# Patient Record
Sex: Male | Born: 1970 | Race: White | Hispanic: No | Marital: Married | Smoking: Never smoker
Health system: Southern US, Community
[De-identification: ages and names within clinical notes are randomized; demographics above are authoritative.]

## PROBLEM LIST (undated history)

## (undated) DIAGNOSIS — M109 Gout, unspecified: Secondary | ICD-10-CM

## (undated) DIAGNOSIS — E785 Hyperlipidemia, unspecified: Secondary | ICD-10-CM

## (undated) DIAGNOSIS — I10 Essential (primary) hypertension: Secondary | ICD-10-CM

## (undated) HISTORY — DX: Essential (primary) hypertension: I10

## (undated) HISTORY — DX: Gout, unspecified: M10.9

## (undated) HISTORY — DX: Hyperlipidemia, unspecified: E78.5

---

## 2015-11-26 ENCOUNTER — Other Ambulatory Visit: Payer: Self-pay | Admitting: Physician Assistant

## 2015-11-26 DIAGNOSIS — R748 Abnormal levels of other serum enzymes: Secondary | ICD-10-CM

## 2015-12-02 ENCOUNTER — Other Ambulatory Visit: Payer: Self-pay

## 2016-06-19 ENCOUNTER — Ambulatory Visit
Admission: RE | Admit: 2016-06-19 | Discharge: 2016-06-19 | Disposition: A | Discharge status: Home / Self Care | Payer: 59 | Source: Ambulatory Visit | Attending: Physician Assistant | Admitting: Physician Assistant

## 2016-06-19 DIAGNOSIS — R748 Abnormal levels of other serum enzymes: Secondary | ICD-10-CM

## 2016-09-04 DIAGNOSIS — H43811 Vitreous degeneration, right eye: Secondary | ICD-10-CM | POA: Diagnosis not present

## 2016-09-04 DIAGNOSIS — H35411 Lattice degeneration of retina, right eye: Secondary | ICD-10-CM | POA: Diagnosis not present

## 2016-09-04 DIAGNOSIS — H35031 Hypertensive retinopathy, right eye: Secondary | ICD-10-CM | POA: Diagnosis not present

## 2016-09-04 DIAGNOSIS — H34831 Tributary (branch) retinal vein occlusion, right eye, with macular edema: Secondary | ICD-10-CM | POA: Diagnosis not present

## 2016-10-02 DIAGNOSIS — H35031 Hypertensive retinopathy, right eye: Secondary | ICD-10-CM | POA: Diagnosis not present

## 2016-10-02 DIAGNOSIS — H34831 Tributary (branch) retinal vein occlusion, right eye, with macular edema: Secondary | ICD-10-CM | POA: Diagnosis not present

## 2016-10-02 DIAGNOSIS — H35411 Lattice degeneration of retina, right eye: Secondary | ICD-10-CM | POA: Diagnosis not present

## 2016-10-02 DIAGNOSIS — H43811 Vitreous degeneration, right eye: Secondary | ICD-10-CM | POA: Diagnosis not present

## 2016-11-02 DIAGNOSIS — H35411 Lattice degeneration of retina, right eye: Secondary | ICD-10-CM | POA: Diagnosis not present

## 2016-11-02 DIAGNOSIS — H34831 Tributary (branch) retinal vein occlusion, right eye, with macular edema: Secondary | ICD-10-CM | POA: Diagnosis not present

## 2016-11-02 DIAGNOSIS — H43811 Vitreous degeneration, right eye: Secondary | ICD-10-CM | POA: Diagnosis not present

## 2016-11-02 DIAGNOSIS — H35031 Hypertensive retinopathy, right eye: Secondary | ICD-10-CM | POA: Diagnosis not present

## 2016-11-09 ENCOUNTER — Telehealth: Payer: Self-pay | Admitting: Oncology

## 2016-11-09 ENCOUNTER — Encounter: Payer: Self-pay | Admitting: Oncology

## 2016-11-09 NOTE — Telephone Encounter (Signed)
Tc from Charles Goodman from the referring office to schedule the pt an appt to see Dr. Clelia CroftShadad on 5/1 at 2pm. Made aware to have pt arrive 30 minutes early. Voiced understanding and will notify the pt. Letter mailed.

## 2016-12-04 DIAGNOSIS — H43811 Vitreous degeneration, right eye: Secondary | ICD-10-CM | POA: Diagnosis not present

## 2016-12-04 DIAGNOSIS — H35031 Hypertensive retinopathy, right eye: Secondary | ICD-10-CM | POA: Diagnosis not present

## 2016-12-04 DIAGNOSIS — H34831 Tributary (branch) retinal vein occlusion, right eye, with macular edema: Secondary | ICD-10-CM | POA: Diagnosis not present

## 2016-12-04 DIAGNOSIS — H35411 Lattice degeneration of retina, right eye: Secondary | ICD-10-CM | POA: Diagnosis not present

## 2016-12-19 ENCOUNTER — Telehealth: Payer: Self-pay | Admitting: Oncology

## 2016-12-19 ENCOUNTER — Ambulatory Visit (HOSPITAL_BASED_OUTPATIENT_CLINIC_OR_DEPARTMENT_OTHER): Payer: 59 | Admitting: Oncology

## 2016-12-19 DIAGNOSIS — D682 Hereditary deficiency of other clotting factors: Secondary | ICD-10-CM | POA: Diagnosis not present

## 2016-12-19 DIAGNOSIS — I10 Essential (primary) hypertension: Secondary | ICD-10-CM | POA: Diagnosis not present

## 2016-12-19 DIAGNOSIS — M109 Gout, unspecified: Secondary | ICD-10-CM | POA: Diagnosis not present

## 2016-12-19 HISTORY — DX: Hereditary deficiency of other clotting factors: D68.2

## 2016-12-19 NOTE — Progress Notes (Signed)
Reason for Referral: Factor II mutation.   HPI: 46 year old gentleman currently resides in [facility] where he has been living for an extended period of time. He is a reasonably healthy gentleman with history of hypertension, mild obesity and gout. He was in his usual state of health until about 6 months ago when he started developing deteriorating vision in his right eye. He was subsequently seen by optometry and referred to ophthalmology. 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 He had been seen a retinal specialist during this time and have been receiving injection of a Avastin every 4 weeks. He was evaluated for a hypercoagulable panel obtained in December 2017 by his primary care physician. He is panel showed no lupus anticoagulant detected, no phospholipid antibodies were detected including anticardiolipin and beta-2 glycoprotein. He normal antithrombin III activity as well as activated protein C resistance. His protein C functional activity was normal. 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 Based on these findings he was referred to me for evaluation.  Clinically, he reports no previous personal history of thrombosis. He denied any history of deep vein thrombosis, pulmonary embolism or superficial phlebitis. He denied any family history of thrombosis. He has had multiple travels in the past including car rides and plan right without any history of thrombosis. He denied any smoking history. He was started on low-dose aspirin at 81 mg daily but did have scleral hemorrhage after his recent a Avastin injection.  He does not report any headaches, blurry vision, syncope or seizures. He does not report any fevers, chills or sweats. He does not report any cough, wheezing or hemoptysis. He does not report any nausea, vomiting or abdominal pain. He does not report any frequency urgency or hesitancy. He does not report any skeletal  complaints. He does not report any thousand and myalgias. Remaining review of systems unremarkable.      Current Outpatient Prescriptions:  .  allopurinol (ZYLOPRIM) 100 MG tablet, Take 1 tablet by mouth daily., Disp: , Rfl: 2 .  lisinopril (PRINIVIL,ZESTRIL) 10 MG tablet, Take 1 tablet by mouth daily., Disp: , Rfl: 1:  No Known Allergies:  No family history on file.:  Social History   Social History  . Marital status: Married    Spouse name: N/A  . Number of children: N/A  . Years of education: N/A   Occupational History  . Not on file.   Social History Main Topics  . Smoking status: Not on file  . Smokeless tobacco: Not on file  . Alcohol use Not on file  . Drug use: Unknown  . Sexual activity: Not on file   Other Topics Concern  . Not on file   Social History Narrative  . No narrative on file  :  Pertinent items are noted in HPI.  Exam: Blood pressure (!) 127/95, pulse 90, temperature 98.2 F (36.8 C), temperature source Oral, resp. rate 18, weight 263 lb 11.2 oz (119.6 kg), SpO2 99 %.  ECOG 0.  General appearance: alert and cooperative appeared without distress. Scleral hemorrhage noted in his right eye. Throat: No oral ulcers or lesions. Neck: no adenopathy Back: negative Resp: clear to auscultation bilaterally no rhonchi, wheezes or dullness to percussion. Chest wall: no tenderness Cardio: regular rate and rhythm, S1, S2 normal, no murmur, click, rub or gallop GI: soft, non-tender; bowel sounds normal; no masses,  no organomegaly Extremities: extremities normal, atraumatic, no cyanosis or edema Pulses: 2+  and symmetric Skin: Skin color, texture, turgor normal. No rashes or lesions    Assessment and Plan:   46 year old gentleman with the following findings:  1. Heterozygous prothrombin gene mutation (factor II) detected on a hypercoagulable panel obtained in December 2017. There is no personal history or family history of previous thrombosis  episodes. He does have history of retinal vein occlusion that he is currently getting treated for.  The implication of this finding was discussed today with the patient in detail. The natural course of inherited thrombophilia was also reviewed. At this time, I see no indication for full dose anticoagulation based on these findings. He is at increased risk of developing predominantly venous thrombosis episodes including deep vein thrombosis and pulmonary embolism. His risk is probably 2-3 times compared to normal population.  We have discussed different strategies to decrease his risk of developing thrombosis episodes which include avoid prolonged immobilization, adequate stretching along cores and flights, adequate hydration among others. Early mobilization after orthopedic surgery would also be recommended and possibly prophylactic anticoagulation.  He understands if he develops deep vein thrombosis or other thrombosis episodes, he will require full dose anticoagulation at that time.  2. Retinal vein occlusion: It is unclear to me whether there is a connection between factor II mutation and this condition. There has been some case reports attributing activated protein C resistance, hyper homocystinemia and the cardiolipin antibody which he does not have.  The evidence to use full dose anticoagulation with heparin, Lovenox, warfarin or Xarelto is lacking and I see no indication for that at this time. I recommended continuing local therapy with his ophthalmologist for the time being and resume full dose anticoagulation if needed to in the future.  3. Follow-up: Will be in 6 months to follow on his clinical status.

## 2016-12-19 NOTE — Telephone Encounter (Signed)
Gave patient avs report and appointments for October  °

## 2016-12-25 DIAGNOSIS — Z1389 Encounter for screening for other disorder: Secondary | ICD-10-CM | POA: Diagnosis not present

## 2016-12-25 DIAGNOSIS — H3500 Unspecified background retinopathy: Secondary | ICD-10-CM | POA: Diagnosis not present

## 2016-12-25 DIAGNOSIS — I10 Essential (primary) hypertension: Secondary | ICD-10-CM | POA: Diagnosis not present

## 2016-12-25 DIAGNOSIS — E669 Obesity, unspecified: Secondary | ICD-10-CM | POA: Diagnosis not present

## 2016-12-25 DIAGNOSIS — M109 Gout, unspecified: Secondary | ICD-10-CM | POA: Diagnosis not present

## 2016-12-25 DIAGNOSIS — E7800 Pure hypercholesterolemia, unspecified: Secondary | ICD-10-CM | POA: Diagnosis not present

## 2017-01-01 DIAGNOSIS — H35411 Lattice degeneration of retina, right eye: Secondary | ICD-10-CM | POA: Diagnosis not present

## 2017-01-01 DIAGNOSIS — H43811 Vitreous degeneration, right eye: Secondary | ICD-10-CM | POA: Diagnosis not present

## 2017-01-01 DIAGNOSIS — H34831 Tributary (branch) retinal vein occlusion, right eye, with macular edema: Secondary | ICD-10-CM | POA: Diagnosis not present

## 2017-01-01 DIAGNOSIS — H35031 Hypertensive retinopathy, right eye: Secondary | ICD-10-CM | POA: Diagnosis not present

## 2017-02-05 DIAGNOSIS — H35031 Hypertensive retinopathy, right eye: Secondary | ICD-10-CM | POA: Diagnosis not present

## 2017-02-05 DIAGNOSIS — H35411 Lattice degeneration of retina, right eye: Secondary | ICD-10-CM | POA: Diagnosis not present

## 2017-02-05 DIAGNOSIS — H34831 Tributary (branch) retinal vein occlusion, right eye, with macular edema: Secondary | ICD-10-CM | POA: Diagnosis not present

## 2017-02-05 DIAGNOSIS — H43811 Vitreous degeneration, right eye: Secondary | ICD-10-CM | POA: Diagnosis not present

## 2017-03-12 DIAGNOSIS — H35411 Lattice degeneration of retina, right eye: Secondary | ICD-10-CM | POA: Diagnosis not present

## 2017-03-12 DIAGNOSIS — H43811 Vitreous degeneration, right eye: Secondary | ICD-10-CM | POA: Diagnosis not present

## 2017-03-12 DIAGNOSIS — H35031 Hypertensive retinopathy, right eye: Secondary | ICD-10-CM | POA: Diagnosis not present

## 2017-03-12 DIAGNOSIS — H34831 Tributary (branch) retinal vein occlusion, right eye, with macular edema: Secondary | ICD-10-CM | POA: Diagnosis not present

## 2017-04-12 DIAGNOSIS — H34831 Tributary (branch) retinal vein occlusion, right eye, with macular edema: Secondary | ICD-10-CM | POA: Diagnosis not present

## 2017-05-22 DIAGNOSIS — H35411 Lattice degeneration of retina, right eye: Secondary | ICD-10-CM | POA: Diagnosis not present

## 2017-05-22 DIAGNOSIS — H43811 Vitreous degeneration, right eye: Secondary | ICD-10-CM | POA: Diagnosis not present

## 2017-05-22 DIAGNOSIS — H35031 Hypertensive retinopathy, right eye: Secondary | ICD-10-CM | POA: Diagnosis not present

## 2017-05-22 DIAGNOSIS — H34831 Tributary (branch) retinal vein occlusion, right eye, with macular edema: Secondary | ICD-10-CM | POA: Diagnosis not present

## 2017-06-15 ENCOUNTER — Telehealth: Payer: Self-pay | Admitting: Oncology

## 2017-06-15 NOTE — Telephone Encounter (Signed)
S/w pt, [facility] 10[facility]. Pt says he will be out of town next week and asks to cancel 11/1. He says he will call us back to r/s later. 11/1 appt cx'd per pt req.

## 2017-06-19 ENCOUNTER — Ambulatory Visit: Payer: 59 | Admitting: Oncology

## 2017-06-21 ENCOUNTER — Ambulatory Visit: Payer: 59 | Admitting: Oncology

## 2017-08-29 DIAGNOSIS — H34831 Tributary (branch) retinal vein occlusion, right eye, with macular edema: Secondary | ICD-10-CM | POA: Diagnosis not present

## 2017-08-29 DIAGNOSIS — H35031 Hypertensive retinopathy, right eye: Secondary | ICD-10-CM | POA: Diagnosis not present

## 2017-08-29 DIAGNOSIS — H35411 Lattice degeneration of retina, right eye: Secondary | ICD-10-CM | POA: Diagnosis not present

## 2017-08-29 DIAGNOSIS — H43811 Vitreous degeneration, right eye: Secondary | ICD-10-CM | POA: Diagnosis not present

## 2017-10-05 DIAGNOSIS — E7800 Pure hypercholesterolemia, unspecified: Secondary | ICD-10-CM | POA: Diagnosis not present

## 2017-10-05 DIAGNOSIS — R7303 Prediabetes: Secondary | ICD-10-CM | POA: Diagnosis not present

## 2017-10-05 DIAGNOSIS — M109 Gout, unspecified: Secondary | ICD-10-CM | POA: Diagnosis not present

## 2017-10-05 DIAGNOSIS — I10 Essential (primary) hypertension: Secondary | ICD-10-CM | POA: Diagnosis not present

## 2018-01-11 DIAGNOSIS — H34831 Tributary (branch) retinal vein occlusion, right eye, with macular edema: Secondary | ICD-10-CM | POA: Diagnosis not present

## 2018-01-11 DIAGNOSIS — H35031 Hypertensive retinopathy, right eye: Secondary | ICD-10-CM | POA: Diagnosis not present

## 2018-01-11 DIAGNOSIS — H35411 Lattice degeneration of retina, right eye: Secondary | ICD-10-CM | POA: Diagnosis not present

## 2018-01-11 DIAGNOSIS — H43811 Vitreous degeneration, right eye: Secondary | ICD-10-CM | POA: Diagnosis not present

## 2018-02-15 DIAGNOSIS — H3581 Retinal edema: Secondary | ICD-10-CM | POA: Diagnosis not present

## 2018-02-15 DIAGNOSIS — H35021 Exudative retinopathy, right eye: Secondary | ICD-10-CM | POA: Diagnosis not present

## 2018-05-15 DIAGNOSIS — Z1331 Encounter for screening for depression: Secondary | ICD-10-CM | POA: Diagnosis not present

## 2018-05-15 DIAGNOSIS — R7303 Prediabetes: Secondary | ICD-10-CM | POA: Diagnosis not present

## 2018-05-15 DIAGNOSIS — M109 Gout, unspecified: Secondary | ICD-10-CM | POA: Diagnosis not present

## 2018-05-15 DIAGNOSIS — I10 Essential (primary) hypertension: Secondary | ICD-10-CM | POA: Diagnosis not present

## 2018-05-15 DIAGNOSIS — E7800 Pure hypercholesterolemia, unspecified: Secondary | ICD-10-CM | POA: Diagnosis not present

## 2018-05-24 DIAGNOSIS — H34831 Tributary (branch) retinal vein occlusion, right eye, with macular edema: Secondary | ICD-10-CM | POA: Diagnosis not present

## 2018-05-24 DIAGNOSIS — H35411 Lattice degeneration of retina, right eye: Secondary | ICD-10-CM | POA: Diagnosis not present

## 2018-05-24 DIAGNOSIS — H35031 Hypertensive retinopathy, right eye: Secondary | ICD-10-CM | POA: Diagnosis not present

## 2018-05-24 DIAGNOSIS — H43811 Vitreous degeneration, right eye: Secondary | ICD-10-CM | POA: Diagnosis not present

## 2018-05-29 DIAGNOSIS — Z23 Encounter for immunization: Secondary | ICD-10-CM | POA: Diagnosis not present

## 2018-09-25 DIAGNOSIS — H43811 Vitreous degeneration, right eye: Secondary | ICD-10-CM | POA: Diagnosis not present

## 2018-09-25 DIAGNOSIS — H34831 Tributary (branch) retinal vein occlusion, right eye, with macular edema: Secondary | ICD-10-CM | POA: Diagnosis not present

## 2018-09-25 DIAGNOSIS — H35021 Exudative retinopathy, right eye: Secondary | ICD-10-CM | POA: Diagnosis not present

## 2018-09-25 DIAGNOSIS — H35031 Hypertensive retinopathy, right eye: Secondary | ICD-10-CM | POA: Diagnosis not present

## 2018-10-30 DIAGNOSIS — H33021 Retinal detachment with multiple breaks, right eye: Secondary | ICD-10-CM | POA: Diagnosis not present

## 2018-10-30 DIAGNOSIS — H35021 Exudative retinopathy, right eye: Secondary | ICD-10-CM | POA: Diagnosis not present

## 2018-10-30 DIAGNOSIS — H34831 Tributary (branch) retinal vein occlusion, right eye, with macular edema: Secondary | ICD-10-CM | POA: Diagnosis not present

## 2018-10-30 DIAGNOSIS — H35411 Lattice degeneration of retina, right eye: Secondary | ICD-10-CM | POA: Diagnosis not present

## 2018-10-31 DIAGNOSIS — H33021 Retinal detachment with multiple breaks, right eye: Secondary | ICD-10-CM | POA: Diagnosis not present

## 2018-11-22 DIAGNOSIS — M109 Gout, unspecified: Secondary | ICD-10-CM | POA: Diagnosis not present

## 2018-11-22 DIAGNOSIS — E7800 Pure hypercholesterolemia, unspecified: Secondary | ICD-10-CM | POA: Diagnosis not present

## 2018-11-22 DIAGNOSIS — I10 Essential (primary) hypertension: Secondary | ICD-10-CM | POA: Diagnosis not present

## 2018-11-22 DIAGNOSIS — R7303 Prediabetes: Secondary | ICD-10-CM | POA: Diagnosis not present

## 2019-01-08 DIAGNOSIS — H35031 Hypertensive retinopathy, right eye: Secondary | ICD-10-CM | POA: Diagnosis not present

## 2019-01-08 DIAGNOSIS — H34831 Tributary (branch) retinal vein occlusion, right eye, with macular edema: Secondary | ICD-10-CM | POA: Diagnosis not present

## 2019-01-28 DIAGNOSIS — H34831 Tributary (branch) retinal vein occlusion, right eye, with macular edema: Secondary | ICD-10-CM | POA: Diagnosis not present

## 2019-02-06 DIAGNOSIS — M109 Gout, unspecified: Secondary | ICD-10-CM | POA: Diagnosis not present

## 2019-02-06 DIAGNOSIS — E7800 Pure hypercholesterolemia, unspecified: Secondary | ICD-10-CM | POA: Diagnosis not present

## 2019-02-06 DIAGNOSIS — K760 Fatty (change of) liver, not elsewhere classified: Secondary | ICD-10-CM | POA: Diagnosis not present

## 2019-02-06 DIAGNOSIS — R7303 Prediabetes: Secondary | ICD-10-CM | POA: Diagnosis not present

## 2019-03-04 DIAGNOSIS — H34831 Tributary (branch) retinal vein occlusion, right eye, with macular edema: Secondary | ICD-10-CM | POA: Diagnosis not present

## 2019-04-29 DIAGNOSIS — H34831 Tributary (branch) retinal vein occlusion, right eye, with macular edema: Secondary | ICD-10-CM | POA: Diagnosis not present

## 2019-04-29 DIAGNOSIS — H35031 Hypertensive retinopathy, right eye: Secondary | ICD-10-CM | POA: Diagnosis not present

## 2019-04-29 DIAGNOSIS — H43822 Vitreomacular adhesion, left eye: Secondary | ICD-10-CM | POA: Diagnosis not present

## 2019-06-18 DIAGNOSIS — Z20828 Contact with and (suspected) exposure to other viral communicable diseases: Secondary | ICD-10-CM | POA: Diagnosis not present

## 2019-06-25 DIAGNOSIS — H43822 Vitreomacular adhesion, left eye: Secondary | ICD-10-CM | POA: Diagnosis not present

## 2019-06-25 DIAGNOSIS — H35021 Exudative retinopathy, right eye: Secondary | ICD-10-CM | POA: Diagnosis not present

## 2019-06-25 DIAGNOSIS — H35031 Hypertensive retinopathy, right eye: Secondary | ICD-10-CM | POA: Diagnosis not present

## 2019-06-25 DIAGNOSIS — H34831 Tributary (branch) retinal vein occlusion, right eye, with macular edema: Secondary | ICD-10-CM | POA: Diagnosis not present

## 2019-07-02 DIAGNOSIS — Z23 Encounter for immunization: Secondary | ICD-10-CM | POA: Diagnosis not present

## 2019-07-02 DIAGNOSIS — R7303 Prediabetes: Secondary | ICD-10-CM | POA: Diagnosis not present

## 2019-07-02 DIAGNOSIS — E7800 Pure hypercholesterolemia, unspecified: Secondary | ICD-10-CM | POA: Diagnosis not present

## 2019-07-02 DIAGNOSIS — Z125 Encounter for screening for malignant neoplasm of prostate: Secondary | ICD-10-CM | POA: Diagnosis not present

## 2019-07-02 DIAGNOSIS — I10 Essential (primary) hypertension: Secondary | ICD-10-CM | POA: Diagnosis not present

## 2019-08-05 DIAGNOSIS — H35021 Exudative retinopathy, right eye: Secondary | ICD-10-CM | POA: Diagnosis not present

## 2019-08-05 DIAGNOSIS — H34831 Tributary (branch) retinal vein occlusion, right eye, with macular edema: Secondary | ICD-10-CM | POA: Diagnosis not present

## 2019-08-06 DIAGNOSIS — I10 Essential (primary) hypertension: Secondary | ICD-10-CM | POA: Diagnosis not present

## 2019-08-06 DIAGNOSIS — R9431 Abnormal electrocardiogram [ECG] [EKG]: Secondary | ICD-10-CM | POA: Diagnosis not present

## 2019-08-06 DIAGNOSIS — R079 Chest pain, unspecified: Secondary | ICD-10-CM | POA: Diagnosis not present

## 2019-08-06 DIAGNOSIS — Z6834 Body mass index (BMI) 34.0-34.9, adult: Secondary | ICD-10-CM | POA: Diagnosis not present

## 2019-08-08 ENCOUNTER — Telehealth: Payer: Self-pay

## 2019-08-08 NOTE — Telephone Encounter (Signed)
NOTES ON FILE FROM [facility] [phone], SENT REFERRAL TO SCHEDULING 

## 2019-08-19 ENCOUNTER — Encounter: Payer: Self-pay | Admitting: Cardiology

## 2019-08-19 ENCOUNTER — Ambulatory Visit (INDEPENDENT_AMBULATORY_CARE_PROVIDER_SITE_OTHER): Payer: BLUE CROSS/BLUE SHIELD | Admitting: Cardiology

## 2019-08-19 ENCOUNTER — Other Ambulatory Visit: Payer: Self-pay

## 2019-08-19 VITALS — BP 112/86 | HR 90 | Ht 72.0 in | Wt 263.0 lb

## 2019-08-19 DIAGNOSIS — I10 Essential (primary) hypertension: Secondary | ICD-10-CM | POA: Diagnosis not present

## 2019-08-19 DIAGNOSIS — Z01812 Encounter for preprocedural laboratory examination: Secondary | ICD-10-CM

## 2019-08-19 DIAGNOSIS — R079 Chest pain, unspecified: Secondary | ICD-10-CM

## 2019-08-19 DIAGNOSIS — R9431 Abnormal electrocardiogram [ECG] [EKG]: Secondary | ICD-10-CM

## 2019-08-19 MED ORDER — NITROGLYCERIN 0.4 MG SL SUBL: 0.4000 mg | SUBLINGUAL_TABLET | SUBLINGUAL | 3 refills | Status: AC | PRN

## 2019-08-19 MED ORDER — METOPROLOL TARTRATE 50 MG PO TABS: ORAL_TABLET | ORAL | 0 refills | Status: DC

## 2019-08-19 NOTE — Patient Instructions (Signed)
Medication Instructions:  Your physician recommends that you continue on your current medications as directed. Please refer to the Current Medication list given to you today.  *If you need a refill on your cardiac medications before your next appointment, please call your pharmacy*  Lab Work: Your physician recommends that you return for lab work in:   3-7 days prior to CT:BMP  PRIOR TO NEXT VISIT: Fasting Lipid  If you have labs (blood work) drawn today and your tests are completely normal, you will receive your results only by: Marland Kitchen MyChart Message (if you have MyChart) OR . A paper copy in the mail If you have any lab test that is abnormal or we need to change your treatment, we will call you to review the results.  Testing/Procedures: Your physician has requested that you have an echocardiogram. Echocardiography is a painless test that uses sound waves to create images of your heart. It provides your doctor with information about the size and shape of your heart and how well your heart's chambers and valves are working. This procedure takes approximately one hour. There are no restrictions for this procedure.  Your physician has requested that you have cardiac CT. Cardiac computed tomography (CT) is a painless test that uses an x-ray machine to take clear, detailed pictures of your heart. For further information please visit [email or web address]. Please follow instruction sheet as given.  Your cardiac CT will be scheduled at one of the below locations:   [facility] [address] [place], [place] [address] [phone]   If scheduled at [facility], please arrive at the [facility] main entrance of [facility] 30-45 minutes prior to test start time. Proceed to the [facility] Radiology Department (first floor) to check-in and test prep.    Please follow these instructions carefully (unless otherwise directed):    On the Night Before the Test: . Be  sure to Drink plenty of water. . Do not consume any caffeinated/decaffeinated beverages or chocolate 12 hours prior to your test. . Do not take any antihistamines 12 hours prior to your test.  On the Day of the Test: . Drink plenty of water. Do not drink any water within one hour of the test. . Do not eat any food 4 hours prior to the test. . You may take your regular medications prior to the test.  . Take metoprolol (Lopressor) two hours prior to test..                  -If HR is less than 55 BPM- No Beta Blocker(metoprolol)                -IF HR is greater than 55 BPM and patient is less than or equal to 78 yrs old Lopressor(metoprolol) 100mg  x1.         After the Test: . Drink plenty of water. . After receiving IV contrast, you may experience a mild flushed feeling. This is normal. . On occasion, you may experience a mild rash up to 24 hours after the test. This is not dangerous. If this occurs, you can take Benadryl 25 mg and increase your fluid intake. . If you experience trouble breathing, this can be serious. If it is severe call 911 IMMEDIATELY. If it is mild, please call our office..   Once we have confirmed authorization from your insurance company, we will call you to set up a date and time for your test.   For non-scheduling  related questions, please contact the cardiac imaging nurse navigator should you have any questions/concerns: Rockwell Alexandria, RN Navigator Cardiac Imaging [facility] [phone] Office    Follow-Up: At [facility], you and your health needs are our priority.  As part of our continuing mission to provide you with exceptional heart care, we have created designated Provider Care Teams.  These Care Teams include your primary Cardiologist (physician) and Advanced Practice Providers (APPs -  Physician Assistants and Nurse Practitioners) who all work together to provide you with the care you need, when you need it.  Your next  appointment:   3 month(s)  The format for your next appointment:   In Person  Provider:   Thomasene Ripple, DO  Other Instructions

## 2019-08-19 NOTE — Progress Notes (Signed)
Cardiology Office Note:    Date:  08/19/2019   ID:  DORSEY AUTHEMENT, DOB 04/26/1971, MRN [id number]  PCP:  System, Pcp Not In  Cardiologist:  No primary care provider on file.  Electrophysiologist:  None   Referring MD: Lonie Peak, PA-C   The patient was referred by his primary provider due to chest pain.  History of Present Illness:    CHAE OOMMEN is a 48 y.o. male with a hx of hypertension diagnosed 6 years ago and is being treated on lisinopril 10 mg daily, hyperlipidemia with most recent LDL at 110 who presents today to be evaluated for chest pain.  Patient tells me that 2 weeks ago he started experience left-sided dull/chest tightening.  He notes that the initial day which was on a Sunday he did not experience any jaw pain but he did get some radiation to his left shoulder.  The next day he tells me that he experienced similar pain which at that time he developed patient his left shoulder and hand numbness.  He denies any shortness of breath, lightheadedness or dizziness.  He tells me that 3-1/2 years ago he experienced similar pain at that time an ECG was done and he was told that everything was okay.  No other complaints at this time  Past Medical History:  Diagnosis Date  . Gout   . Hyperlipidemia   . Hypertension     History reviewed. No pertinent surgical history.  Current Medications: Current Meds  Medication Sig  . allopurinol (ZYLOPRIM) 100 MG tablet Take 1 tablet by mouth daily.  Marland Kitchen lisinopril (PRINIVIL,ZESTRIL) 10 MG tablet Take 1 tablet by mouth daily.     Allergies:   Patient has no known allergies.   Social History   Socioeconomic History  . Marital status: Married    Spouse name: Not on file  . Number of children: Not on file  . Years of education: Not on file  . Highest education level: Not on file  Occupational History  . Not on file  Tobacco Use  . Smoking status: Never Smoker  . Smokeless tobacco: Never Used  Substance and  Sexual Activity  . Alcohol use: Not on file  . Drug use: Not on file  . Sexual activity: Not on file  Other Topics Concern  . Not on file  Social History Narrative  . Not on file   Social Determinants of Health   Financial Resource Strain:   . Difficulty of Paying Living Expenses: Not on file  Food Insecurity:   . Worried About Programme researcher, broadcasting/film/video in the Last Year: Not on file  . Ran Out of Food in the Last Year: Not on file  Transportation Needs:   . Lack of Transportation (Medical): Not on file  . Lack of Transportation (Non-Medical): Not on file  Physical Activity:   . Days of Exercise per Week: Not on file  . Minutes of Exercise per Session: Not on file  Stress:   . Feeling of Stress : Not on file  Social Connections:   . Frequency of Communication with Friends and Family: Not on file  . Frequency of Social Gatherings with Friends and Family: Not on file  . Attends Religious Services: Not on file  . Active Member of Clubs or Organizations: Not on file  . Attends Banker Meetings: Not on file  . Marital Status: Not on file     Family History: The patient's family history includes High blood  pressure in his father.  ROS:   Review of Systems  Constitution: Negative for decreased appetite, fever and weight gain.  HENT: Negative for congestion, ear discharge, hoarse voice and sore throat.   Eyes: Negative for discharge, redness, vision loss in right eye and visual halos.  Cardiovascular: Negative for chest pain, dyspnea on exertion, leg swelling, orthopnea and palpitations.  Respiratory: Negative for cough, hemoptysis, shortness of breath and snoring.   Endocrine: Negative for heat intolerance and polyphagia.  Hematologic/Lymphatic: Negative for bleeding problem. Does not bruise/bleed easily.  Skin: Negative for flushing, nail changes, rash and suspicious lesions.  Musculoskeletal: Negative for arthritis, joint pain, muscle cramps, myalgias, neck pain and  stiffness.  Gastrointestinal: Negative for abdominal pain, bowel incontinence, diarrhea and excessive appetite.  Genitourinary: Negative for decreased libido, genital sores and incomplete emptying.  Neurological: Negative for brief paralysis, focal weakness, headaches and loss of balance.  Psychiatric/Behavioral: Negative for altered mental status, depression and suicidal ideas.  Allergic/Immunologic: Negative for HIV exposure and persistent infections.    EKGs/Labs/Other Studies Reviewed:    The following studies were reviewed today:   EKG:  The ekg ordered today demonstrates sinus rhythm, heart rate 80 bpm with poor R wave progression which could be suggestive of septal infarction.  No prior EKG for comparison.  Recent Labs: No results found for requested labs within last 8760 hours.  Recent Lipid Panel No results found for: CHOL, TRIG, HDL, CHOLHDL, VLDL, LDLCALC, LDLDIRECT  Physical Exam:    VS:  BP 112/86   Pulse 90   Ht 6' (1.829 m)   Wt 263 lb (119.3 kg)   SpO2 97%   BMI 35.67 kg/m     Wt Readings from Last 3 Encounters:  08/19/19 263 lb (119.3 kg)  12/19/16 263 lb 11.2 oz (119.6 kg)     GEN: Well nourished, well developed in no acute distress HEENT: Normal NECK: No JVD; No carotid bruits LYMPHATICS: No lymphadenopathy CARDIAC: S1S2 noted,RRR, no murmurs, rubs, gallops RESPIRATORY:  Clear to auscultation without rales, wheezing or rhonchi  ABDOMEN: Soft, non-tender, non-distended, +bowel sounds, no guarding. EXTREMITIES: No edema, No cyanosis, no clubbing MUSCULOSKELETAL:  No edema; No deformity  SKIN: Warm and dry NEUROLOGIC:  Alert and oriented x 3, non-focal PSYCHIATRIC:  Normal affect, good insight  ASSESSMENT:    1. Chest pain, unspecified type   2. Hypertension, unspecified type   3. Pre-procedure lab exam   4. Abnormal EKG    PLAN:     1.  His chest pain is concerning given his risk factors and abnormal EKG therefore at this time I would like to  pursue an ischemic evaluation.  Coronary CTA would be appropriate at this time.  Patient was educated on this testing.  He does not have any IV contrast dye allergy.  He is agreeable to proceed with this test.  Sublingual nitroglycerin prescription was sent, its protocol and 911 protocol explained and the patient vocalized understanding questions were answered to the patient's satisfaction.  2.  His blood pressure is controlled at this time therefore there will be no changes in his antihypertensive medication.  Transthoracic echocardiogram will be performed to assess for any RV/LV function and any valvular dysfunction.  3.  Hyperlipidemia-his last LDL was 110 I discussed with the patient about diet modification.  He prefers to pursue with diet modification and repeat his testing in 3 months at which time it may be beneficial to start the patient on a moderate intensity statin.  The patient  and his wife who was on the phone is in agreement with the above plan. The patient left the office in stable condition.  The patient will follow up in 3 months or sooner if needed    Medication Adjustments/Labs and Tests Ordered: Current medicines are reviewed at length with the patient today.  Concerns regarding medicines are outlined above.  Orders Placed This Encounter  Procedures  . CT CORONARY FRACTIONAL FLOW RESERVE DATA PREP  . CT CORONARY FRACTIONAL FLOW RESERVE FLUID ANALYSIS  . CT CORONARY MORPH W/CTA COR W/SCORE W/CA W/CM &/OR WO/CM  . Basic Metabolic Panel (BMET)  . Lipid Profile  . EKG 12-Lead  . ECHOCARDIOGRAM COMPLETE   Meds ordered this encounter  Medications  . nitroGLYCERIN (NITROSTAT) 0.4 MG SL tablet    Sig: Place 1 tablet (0.4 mg total) under the tongue every 5 (five) minutes as needed.    Dispense:  30 tablet    Refill:  3  . metoprolol tartrate (LOPRESSOR) 50 MG tablet    Sig: Take 2 tabs (100 mg) 2 hours prior to CT appointment    Dispense:  2 tablet    Refill:  0    Patient  Instructions  Medication Instructions:  Your physician recommends that you continue on your current medications as directed. Please refer to the Current Medication list given to you today.  *If you need a refill on your cardiac medications before your next appointment, please call your pharmacy*  Lab Work: Your physician recommends that you return for lab work in:   3-7 days prior to CT:BMP  PRIOR TO NEXT VISIT: Fasting Lipid  If you have labs (blood work) drawn today and your tests are completely normal, you will receive your results only by: Marland Kitchen. MyChart Message (if you have MyChart) OR . A paper copy in the mail If you have any lab test that is abnormal or we need to change your treatment, we will call you to review the results.  Testing/Procedures: Your physician has requested that you have an echocardiogram. Echocardiography is a painless test that uses sound waves to create images of your heart. It provides your doctor with information about the size and shape of your heart and how well your heart's chambers and valves are working. This procedure takes approximately one hour. There are no restrictions for this procedure.  Your physician has requested that you have cardiac CT. Cardiac computed tomography (CT) is a painless test that uses an x-ray machine to take clear, detailed pictures of your heart. For further information please visit [email or web address]. Please follow instruction sheet as given.  Your cardiac CT will be scheduled at one of the below locations:   [facility] [address] [place], [place] [address] [phone]   If scheduled at [facility], please [facility] the [facility] main entrance of [facility] 30-45 minutes prior to test start time. Proceed to the [facility] Radiology Department (first floor) to check-in and test prep.    Please follow these instructions carefully (unless otherwise directed):    On the Night Before  the Test: . Be sure to Drink plenty of water. . Do not consume any caffeinated/decaffeinated beverages or chocolate 12 hours prior to your test. . Do not take any antihistamines 12 hours prior to your test.  On the Day of the Test: . Drink plenty of water. Do not drink any water within one hour of the test. . Do not eat any food 4 hours  prior to the test. . You may take your regular medications prior to the test.  . Take metoprolol (Lopressor) two hours prior to test..                  -If HR is less than 55 BPM- No Beta Blocker(metoprolol)                -IF HR is greater than 55 BPM and patient is less than or equal to 45 yrs old Lopressor(metoprolol)  x1.         After the Test: . Drink plenty of water. . After receiving IV contrast, you may experience a mild flushed feeling. This is normal. . On occasion, you may experience a mild rash up to 24 hours after the test. This is not dangerous. If this occurs, you can take Benadryl 25 mg and increase your fluid intake. . If you experience trouble breathing, this can be serious. If it is severe call 911 IMMEDIATELY. If it is mild, please call our office..   Once we have confirmed authorization from your insurance company, we will call you to set up a date and time for your test.   For non-scheduling related questions, please contact the cardiac imaging nurse navigator should you have any questions/concerns: Rockwell Alexandria, RN Navigator Cardiac Imaging [facility] [phone] Office    Follow-Up: At [facility], you and your health needs are our priority.  As part of our continuing mission to provide you with exceptional heart care, we have created designated Provider Care Teams.  These Care Teams include your primary Cardiologist (physician) and Advanced Practice Providers (APPs -  Physician Assistants and Nurse Practitioners) who all work together to provide you with the care you need, when you need  it.  Your next appointment:   3 month(s)  The format for your next appointment:   In Person  Provider:   Thomasene Ripple, DO  Other Instructions      Adopting a Healthy Lifestyle.  Know what a healthy weight is for you (roughly BMI <25) and aim to maintain this   Aim for 7+ servings of fruits and vegetables daily   65-80+ fluid ounces of water or unsweet tea for healthy kidneys   Limit to max 1 drink of alcohol per day; avoid smoking/tobacco   Limit animal fats in diet for cholesterol and heart health - choose grass fed whenever available   Avoid highly processed foods, and foods high in saturated/trans fats   Aim for low stress - take time to unwind and care for your mental health   Aim for 150 min of moderate intensity exercise weekly for heart health, and weights twice weekly for bone health   Aim for 7-9 hours of sleep daily   When it comes to diets, agreement about the perfect plan isnt easy to find, even among the experts. Experts at the [facility] [employer] developed an idea known as the Healthy Eating Plate. Just imagine a plate divided into logical, healthy portions.   The emphasis is on diet quality:   Load up on vegetables and fruits - one-half of your plate: Aim for color and variety, and remember that potatoes dont count.   Go for whole grains - one-quarter of your plate: Whole wheat, barley, wheat berries, quinoa, oats, brown rice, and foods made with them. If you want pasta, go with whole wheat pasta.   Protein power - one-quarter of your plate: Fish,  chicken, beans, and nuts are all healthy, versatile protein sources. Limit red meat.   The diet, however, does go beyond the plate, offering a few other suggestions.   Use healthy plant oils, such as olive, canola, soy, corn, sunflower and peanut. Check the labels, and avoid partially hydrogenated oil, which have unhealthy trans fats.   If youre thirsty, drink water. Coffee and tea are good in  moderation, but skip sugary drinks and limit milk and dairy products to one or two daily servings.   The type of carbohydrate in the diet is more important than the amount. Some sources of carbohydrates, such as vegetables, fruits, whole grains, and beans-are healthier than others.   Finally, stay active  Signed, Thomasene Ripple, DO  08/19/2019 12:03 PM    [facility]

## 2019-09-10 ENCOUNTER — Telehealth (HOSPITAL_COMMUNITY): Payer: Self-pay | Admitting: Emergency Medicine

## 2019-09-10 NOTE — Telephone Encounter (Signed)
Left message on voicemail with name and callback number Blaire Hodsdon RN Navigator Cardiac Imaging [facility] [phone] Office [phone] Cell  

## 2019-09-10 NOTE — Telephone Encounter (Signed)
Pt returning phone call regarding upcoming cardiac imaging study; pt verbalizes understanding of appt date/time, parking situation and where to check in, pre-test NPO status and medications ordered, and verified current allergies; name and call back number provided for further questions should they arise Joanny Dupree RN Navigator Cardiac Imaging [facility] [phone] office [phone] cell   

## 2019-09-11 ENCOUNTER — Ambulatory Visit (HOSPITAL_COMMUNITY)
Admission: RE | Admit: 2019-09-11 | Discharge: 2019-09-11 | Disposition: A | Discharge status: Home / Self Care | Payer: 59 | Source: Ambulatory Visit | Attending: Cardiology | Admitting: Cardiology

## 2019-09-11 ENCOUNTER — Telehealth: Payer: Self-pay | Admitting: Nurse Practitioner

## 2019-09-11 ENCOUNTER — Other Ambulatory Visit: Payer: Self-pay

## 2019-09-11 DIAGNOSIS — R079 Chest pain, unspecified: Secondary | ICD-10-CM | POA: Insufficient documentation

## 2019-09-11 IMAGING — CT CT HEART MORP W/ CTA COR W/ SCORE W/ CA W/CM &/OR W/O CM
4 of 7 series · 8 of 20 positions shown, 9 images · non-contrast
Comparison: None.
COMPARISON: None.
COMPARISON: None.

Addendum:
EXAM:
OVER-READ INTERPRETATION  CT CHEST

The following report is an over-read performed by radiologist Dr.
Gracy Bohm [REDACTED] on 09/11/2019. This
over-read does not include interpretation of cardiac or coronary
anatomy or pathology. The coronary calcium score/coronary CTA
interpretation by the cardiologist is attached.
CLINICAL DATA: 48 year old male with atypical angina. Medical
history includes Hypertension and Hyperlipidemia.
Cardiac/Coronary  CT
TECHNIQUE: The patient was scanned on a Phillips Force scanner.
CLINICAL DATA: 48 year old male with CAD and hypertension.

[Series 7: best diast 71 % · axial · 0.39mm/px · z∈[-187,-143]mm · 2 of 333 slices shown]
[im 111/333  vessel]
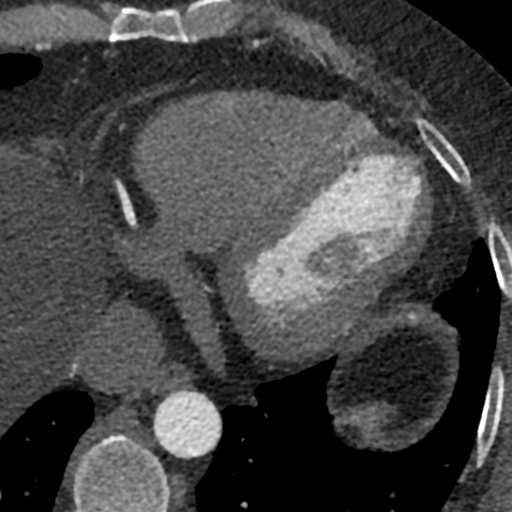
[im 222/333  vessel]
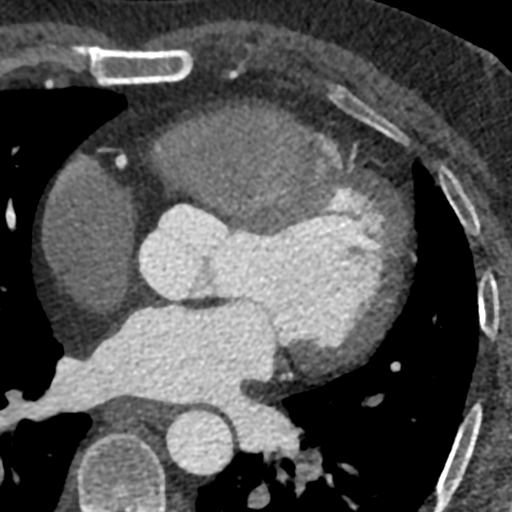

[Series 8: best syst · axial · 0.39mm/px · z∈[-187,-143]mm · 2 of 333 slices shown, 3 images]
[im 111/333  vessel]
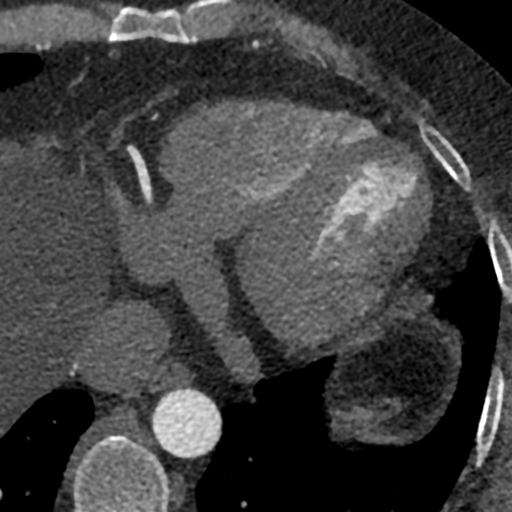
[im 111/333  lung]
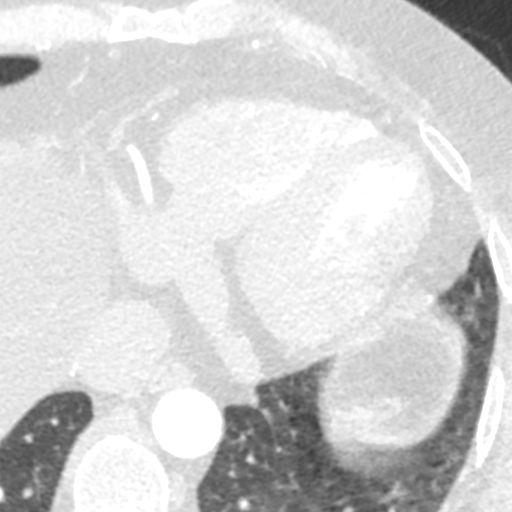
[im 222/333  vessel]
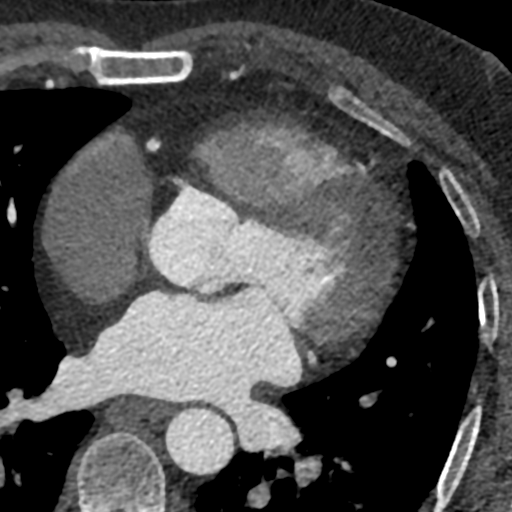

[Series 9: ts diast sharp · axial · 0.39mm/px · z∈[-187,-143]mm · 2 of 333 slices shown]
[im 111/333  lung]
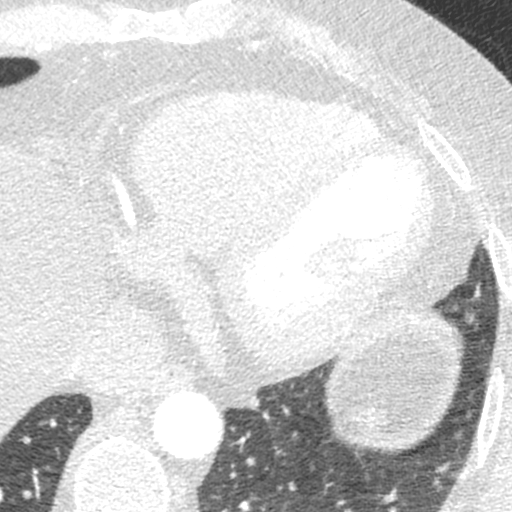
[im 222/333  lung]
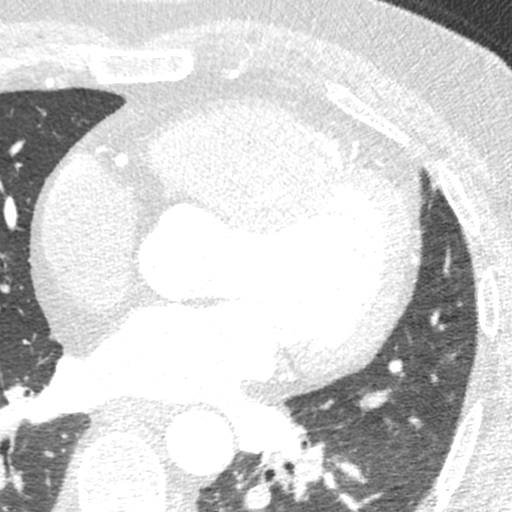

[Series 10: ts syst sharp · axial · 0.39mm/px · z∈[-187,-143]mm · 2 of 333 slices shown]
[im 111/333  lung]
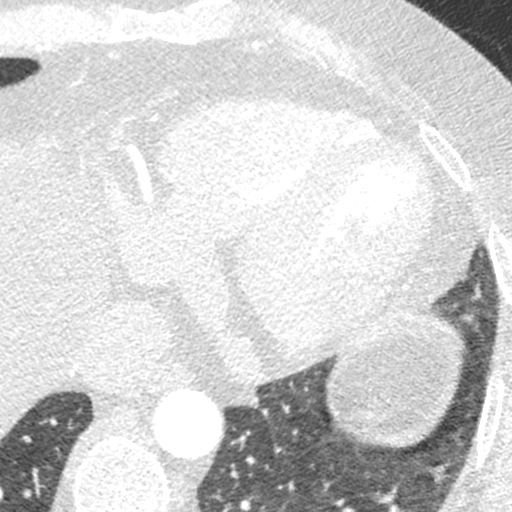
[im 222/333  lung]
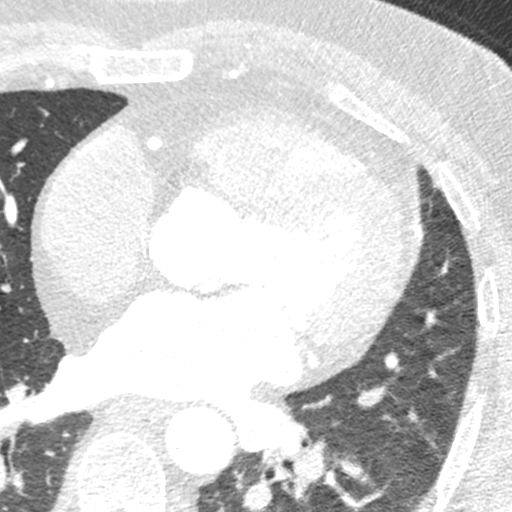

[8 of 20 positions shown; findings below may reference images not displayed]

FINDINGS: Within the visualized portions of the thorax there are no suspicious
appearing pulmonary nodules or masses, there is no acute
consolidative airspace disease, no pleural effusions, no
pneumothorax and no lymphadenopathy. Visualized portions of the
upper abdomen are unremarkable. There are no aggressive appearing
lytic or blastic lesions noted in the visualized portions of the
skeleton.
IMPRESSION: 1. No significant incidental noncardiac findings are noted.
FINDINGS: A 120 kV prospective scan was triggered in the descending thoracic
aorta at 111 HU's. Axial non-contrast 3 mm slices were carried out
through the heart. The data set was analyzed on a dedicated work
station and scored using the Agatson method. Gantry rotation speed
was 250 msecs and collimation was .6 mm. No beta blockade and 0.8 mg
of sl NTG was given. The 3D data set was reconstructed in 5%
intervals of the 67-82 % of the R-R cycle. Diastolic phases were
analyzed on a dedicated work station using MPR, MIP and VRT modes.
The patient received 80 cc of contrast.

Aorta: Normal size.  No calcifications.  No dissection.

Aortic Valve:  Trileaflet.  No calcifications.

Coronary Arteries:  Normal coronary origin.  Right dominance.

RCA is a large dominant artery that gives rise to PDA and PLVB.
There is no plaque.

Left main is a large artery that gives rise to LAD and LCX arteries.

LAD is a large vessel with mild (25-49%) calcified plaque in the
proximal portion of the vessel. Minimal calcified plaque in the mid
portion of the vessel. There is no plaque in the distal portion of
the vessel. D1 has a high take off and it is a medium caliber vessel
with mild soft plaque in the proximal portion. D2 is a large caliber
vessel with minimal mid vessel calcified plaque.

LCX is a non-dominant artery that gives rise to one large OM1
branch. There is no plaque.

Other findings:

Normal pulmonary vein drainage into the left atrium.

Normal left atrial appendage without a thrombus.

Normal size of the pulmonary artery.
IMPRESSION: 1. Coronary calcium score of 79. This was 98 percentile for age and
sex matched control.

2. Normal coronary origin with right dominance.

3. Mild Coronary Artery Disease. CAD-RADS 2. Aggressive medical
therapy is recommended.

Klever Jumper, DO

EXAM:
CT FFR ANALYSIS
FINDINGS: FFRct analysis was performed on the original cardiac CT angiogram
dataset. Diagrammatic representation of the FFRct analysis is
provided in a separate PDF document in PACS. This dictation was
created using the PDF document and an interactive 3D model of the
results. 3D model is not available in the EMR/PACS. Normal FFR range
is >0.80.

1. Left Main:

2. LAD: Proximal 0.97, Mid 0.93, Distal 0.90. D1: Proximal 0.98, Mid
0.96, Distal
3. LCX: Proximal 0.97, Mid 0.96, Distal 0.94. OM1 proximal 0.90,
Distal
4. RCA:
IMPRESSION: 1. CT FFR analysis as described above abnormal in the distal D1
vessel. This is a very short portion of the vessel. Recommend
aggressive medical management prior to consideration of left heart
catheterization.

*** End of Addendum ***
Addendum:
EXAM:
OVER-READ INTERPRETATION  CT CHEST

The following report is an over-read performed by radiologist Dr.
Gracy Bohm [REDACTED] on 09/11/2019. This
over-read does not include interpretation of cardiac or coronary
anatomy or pathology. The coronary calcium score/coronary CTA
interpretation by the cardiologist is attached.
FINDINGS: Within the visualized portions of the thorax there are no suspicious
appearing pulmonary nodules or masses, there is no acute
consolidative airspace disease, no pleural effusions, no
pneumothorax and no lymphadenopathy. Visualized portions of the
upper abdomen are unremarkable. There are no aggressive appearing
lytic or blastic lesions noted in the visualized portions of the
skeleton.
IMPRESSION: 1. No significant incidental noncardiac findings are noted.
FINDINGS: A 120 kV prospective scan was triggered in the descending thoracic
aorta at 111 HU's. Axial non-contrast 3 mm slices were carried out
through the heart. The data set was analyzed on a dedicated work
station and scored using the Agatson method. Gantry rotation speed
was 250 msecs and collimation was .6 mm. No beta blockade and 0.8 mg
of sl NTG was given. The 3D data set was reconstructed in 5%
intervals of the 67-82 % of the R-R cycle. Diastolic phases were
analyzed on a dedicated work station using MPR, MIP and VRT modes.
The patient received 80 cc of contrast.

Aorta: Normal size.  No calcifications.  No dissection.

Aortic Valve:  Trileaflet.  No calcifications.

Coronary Arteries:  Normal coronary origin.  Right dominance.

RCA is a large dominant artery that gives rise to PDA and PLVB.
There is no plaque.

Left main is a large artery that gives rise to LAD and LCX arteries.

LAD is a large vessel with mild (25-49%) calcified plaque in the
proximal portion of the vessel. Minimal calcified plaque in the mid
portion of the vessel. There is no plaque in the distal portion of
the vessel. D1 has a high take off and it is a medium caliber vessel
with mild soft plaque in the proximal portion. D2 is a large caliber
vessel with minimal mid vessel calcified plaque.

LCX is a non-dominant artery that gives rise to one large OM1
branch. There is no plaque.

Other findings:

Normal pulmonary vein drainage into the left atrium.

Normal left atrial appendage without a thrombus.

Normal size of the pulmonary artery.
IMPRESSION: 1. Coronary calcium score of 79. This was 98 percentile for age and
sex matched control.

2. Normal coronary origin with right dominance.

3. Mild Coronary Artery Disease. CAD-RADS 2. Aggressive medical
therapy is recommended.

Klever Jumper, DO

*** End of Addendum ***
EXAM:
OVER-READ INTERPRETATION  CT CHEST

The following report is an over-read performed by radiologist Dr.
Gracy Bohm [REDACTED] on 09/11/2019. This
over-read does not include interpretation of cardiac or coronary
anatomy or pathology. The coronary calcium score/coronary CTA
interpretation by the cardiologist is attached.
FINDINGS: Within the visualized portions of the thorax there are no suspicious
appearing pulmonary nodules or masses, there is no acute
consolidative airspace disease, no pleural effusions, no
pneumothorax and no lymphadenopathy. Visualized portions of the
upper abdomen are unremarkable. There are no aggressive appearing
lytic or blastic lesions noted in the visualized portions of the
skeleton.
IMPRESSION: 1. No significant incidental noncardiac findings are noted.

## 2019-09-11 MED ORDER — NITROGLYCERIN 0.4 MG SL SUBL
0.8000 mg | SUBLINGUAL_TABLET | Freq: Once | SUBLINGUAL | Status: AC
  Administered 2019-09-11: 0.8 mg via SUBLINGUAL

## 2019-09-11 MED ORDER — NITROGLYCERIN 0.4 MG SL SUBL
SUBLINGUAL_TABLET | SUBLINGUAL | Status: AC
  Filled 2019-09-11: qty 2

## 2019-09-11 MED ORDER — SODIUM CHLORIDE 0.9 % IV BOLUS
500.0000 mL | Freq: Once | INTRAVENOUS | Status: AC
  Administered 2019-09-11: 500 mL via INTRAVENOUS

## 2019-09-11 MED ORDER — IOHEXOL 350 MG/ML SOLN
80.0000 mL | Freq: Once | INTRAVENOUS | Status: AC | PRN
  Administered 2019-09-11: 80 mL via INTRAVENOUS

## 2019-09-11 MED ORDER — ATORVASTATIN CALCIUM 20 MG PO TABS: 20.0000 mg | ORAL_TABLET | Freq: Every day | ORAL | 3 refills | Status: AC

## 2019-09-11 MED ORDER — METOPROLOL TARTRATE 5 MG/5ML IV SOLN: 5.0000 mg | INTRAVENOUS | Status: DC | PRN

## 2019-09-11 MED ORDER — ASPIRIN EC 81 MG PO TBEC: 81.0000 mg | DELAYED_RELEASE_TABLET | Freq: Every day | ORAL | Status: AC

## 2019-09-11 MED ORDER — METOPROLOL TARTRATE 5 MG/5ML IV SOLN
INTRAVENOUS | Status: AC
  Filled 2019-09-11: qty 15

## 2019-09-11 NOTE — Telephone Encounter (Signed)
Reviewed results of CT with patient. He verbalized understanding and agreement with plan of care. I answered questions to his satisfaction including the question regarding injections he has to get periodically for swelling of his eye. I advised he may hold the aspirin for that procedure but if he ever has coronary intervention in the future, he will need to talk specifically with his provider to get permission to hold medications for procedures. He verbalized understanding and thanked me for the call.

## 2019-09-11 NOTE — Telephone Encounter (Signed)
-----   Message from Thomasene Ripple, DO sent at 09/11/2019  3:10 PM EST ----- CT scan showed mild blockage in the LAD.  Therefore you have a new diagnosis of coronary artery disease.  I would like to start you on aspirin 81 mg daily as well as Lipitor 20 mg daily for aggressive medical management to slow down any progression of this.

## 2019-10-10 ENCOUNTER — Other Ambulatory Visit: Payer: 59

## 2019-10-21 ENCOUNTER — Other Ambulatory Visit: Payer: Self-pay

## 2019-10-21 ENCOUNTER — Ambulatory Visit (HOSPITAL_BASED_OUTPATIENT_CLINIC_OR_DEPARTMENT_OTHER)
Admission: RE | Admit: 2019-10-21 | Discharge: 2019-10-21 | Disposition: A | Discharge status: Home / Self Care | Payer: 59 | Source: Ambulatory Visit | Attending: Cardiology | Admitting: Cardiology

## 2019-10-21 DIAGNOSIS — R079 Chest pain, unspecified: Secondary | ICD-10-CM | POA: Diagnosis not present

## 2019-10-21 DIAGNOSIS — I10 Essential (primary) hypertension: Secondary | ICD-10-CM | POA: Insufficient documentation

## 2019-10-21 NOTE — Progress Notes (Signed)
  Echocardiogram 2D Echocardiogram has been performed.  Charles Goodman 10/21/2019, 9:46 AM

## 2019-11-26 ENCOUNTER — Encounter: Payer: Self-pay | Admitting: Cardiology

## 2019-11-26 ENCOUNTER — Other Ambulatory Visit: Payer: Self-pay

## 2019-11-26 ENCOUNTER — Ambulatory Visit (INDEPENDENT_AMBULATORY_CARE_PROVIDER_SITE_OTHER): Payer: 59 | Admitting: Cardiology

## 2019-11-26 VITALS — BP 110/86 | HR 106 | Ht 72.0 in | Wt 262.0 lb

## 2019-11-26 DIAGNOSIS — E785 Hyperlipidemia, unspecified: Secondary | ICD-10-CM

## 2019-11-26 DIAGNOSIS — E669 Obesity, unspecified: Secondary | ICD-10-CM

## 2019-11-26 DIAGNOSIS — I2510 Atherosclerotic heart disease of native coronary artery without angina pectoris: Secondary | ICD-10-CM

## 2019-11-26 DIAGNOSIS — Z79899 Other long term (current) drug therapy: Secondary | ICD-10-CM

## 2019-11-26 HISTORY — DX: Other long term (current) drug therapy: Z79.899

## 2019-11-26 HISTORY — DX: Atherosclerotic heart disease of native coronary artery without angina pectoris: I25.10

## 2019-11-26 HISTORY — DX: Obesity, unspecified: E66.9

## 2019-11-26 NOTE — Progress Notes (Signed)
Cardiology Office Note:    Date:  11/26/2019   ID:  Charles Goodman, DOB Oct 05, 1970, MRN [id number]  PCP:  Lonie Peak, PA-C  Cardiologist:  Thomasene Ripple, DO  Electrophysiologist:  None   Referring MD: No ref. provider found   Follow up for coronary artery disease " I am still having chest pain"  History of Present Illness:    Charles Goodman is a 49 y.o. male with a hx of mild coronary disease, hypertension, hyperlipidemia presents today patient presents with 2020 at that time he was experiencing chest pain we proceeded with a coronary CTA which showed mild CAD.  He also had a transthoracic echocardiogram normal.  Today he presents for follow-up visit.  Today patient tells me he is experiencing intermittent chest pain.  He describes it as days since by his computer especially when his projects he feels some left-sided chest tightness can last for hours as well as he is involved in work.  On the other hand he tells me that he is at his leisure time he does not experience this.  He states he is doing fine for the weekend when he is on time he does not experience any chest tightness but Mondays breath at work this morning he felt chest treatment.  Denies palpitations, shortness of breath, or numbness.  Past Medical History:  Diagnosis Date  . Gout   . Hyperlipidemia   . Hypertension     No past surgical history on file.  Current Medications: Current Meds  Medication Sig  . allopurinol (ZYLOPRIM) 100 MG tablet Take 1 tablet by mouth daily.  Marland Kitchen aspirin EC 81 MG tablet Take 1 tablet (81 mg total) by mouth daily.  Marland Kitchen atorvastatin (LIPITOR) 20 MG tablet Take 1 tablet (20 mg total) by mouth daily.  Marland Kitchen lisinopril (PRINIVIL,ZESTRIL) 10 MG tablet Take 1 tablet by mouth daily.  . nitroGLYCERIN (NITROSTAT) 0.4 MG SL tablet Place 1 tablet (0.4 mg total) under the tongue every 5 (five) minutes as needed.     Allergies:   Patient has no known allergies.   Social History    Socioeconomic History  . Marital status: Married    Spouse name: Not on file  . Number of children: Not on file  . Years of education: Not on file  . Highest education level: Not on file  Occupational History  . Not on file  Tobacco Use  . Smoking status: Never Smoker  . Smokeless tobacco: Never Used  Substance and Sexual Activity  . Alcohol use: Not on file  . Drug use: Not on file  . Sexual activity: Not on file  Other Topics Concern  . Not on file  Social History Narrative  . Not on file   Social Determinants of Health   Financial Resource Strain:   . Difficulty of Paying Living Expenses:   Food Insecurity:   . Worried About Programme researcher, broadcasting/film/video in the Last Year:   . Barista in the Last Year:   Transportation Needs:   . Freight forwarder (Medical):   Marland Kitchen Lack of Transportation (Non-Medical):   Physical Activity:   . Days of Exercise per Week:   . Minutes of Exercise per Session:   Stress:   . Feeling of Stress :   Social Connections:   . Frequency of Communication with Friends and Family:   . Frequency of Social Gatherings with Friends and Family:   . Attends Religious Services:   . Active Member  of Clubs or Organizations:   . Attends Banker Meetings:   Marland Kitchen Marital Status:      Family History: The patient's family history includes High blood pressure in his father.  ROS:   Review of Systems  Constitution: Negative for decreased appetite, fever and weight gain.  HENT: Negative for congestion, ear discharge, hoarse voice and sore throat.   Eyes: Negative for discharge, redness, vision loss in right eye and visual halos.  Cardiovascular: Reports chest pain. Negative for  dyspnea on exertion, leg swelling, orthopnea and palpitations.  Respiratory: Negative for cough, hemoptysis, shortness of breath and snoring.   Endocrine: Negative for heat intolerance and polyphagia.  Hematologic/Lymphatic: Negative for bleeding problem. Does not  bruise/bleed easily.  Skin: Negative for flushing, nail changes, rash and suspicious lesions.  Musculoskeletal: Negative for arthritis, joint pain, muscle cramps, myalgias, neck pain and stiffness.  Gastrointestinal: Negative for abdominal pain, bowel incontinence, diarrhea and excessive appetite.  Genitourinary: Negative for decreased libido, genital sores and incomplete emptying.  Neurological: Negative for brief paralysis, focal weakness, headaches and loss of balance.  Psychiatric/Behavioral: Negative for altered mental status, depression and suicidal ideas.  Allergic/Immunologic: Negative for HIV exposure and persistent infections.    EKGs/Labs/Other Studies Reviewed:    The following studies were reviewed today:   EKG:  None today  TTE IMPRESSIONS 10/21/2019 1. Left ventricular ejection fraction, by estimation, is 50 to 55%. The  left ventricle has low normal function. The left ventricle has no regional  wall motion abnormalities. Left ventricular diastolic parameters were  normal.   CCTA 09/11/2019 Aorta: Normal size.  No calcifications.  No dissection.  Aortic Valve:  Trileaflet.  No calcifications.  Coronary Arteries:  Normal coronary origin.  Right dominance.  RCA is a large dominant artery that gives rise to PDA and PLVB. There is no plaque.  Left main is a large artery that gives rise to LAD and LCX arteries.  LAD is a large vessel with mild (25-49%) calcified plaque in the proximal portion of the vessel. Minimal calcified plaque in the mid portion of the vessel. There is no plaque in the distal portion of the vessel. D1 has a high take off and it is a medium caliber vessel with mild soft plaque in the proximal portion. D2 is a large caliber vessel with minimal mid vessel calcified plaque.  LCX is a non-dominant artery that gives rise to one large OM1 branch. There is no plaque.  Other findings:  Normal pulmonary vein drainage into the left atrium.   Normal left atrial appendage without a thrombus.  Normal size of the pulmonary artery.  IMPRESSION: 1. Coronary calcium score of 79. This was 26 percentile for age and sex matched control.  2. Normal coronary origin with right dominance.  3. Mild Coronary Artery Disease. CAD-RADS 2. Aggressive medical therapy is recommended.   Recent Labs: No results found for requested labs within last 8760 hours.  Recent Lipid Panel No results found for: CHOL, TRIG, HDL, CHOLHDL, VLDL, LDLCALC, LDLDIRECT  Physical Exam:    VS:  BP 110/86 (BP Location: Right Arm, Patient Position: Sitting, Cuff Size: Normal)   Pulse (!) 106   Ht 6' (1.829 m)   Wt 262 lb (118.8 kg)   SpO2 98%   BMI 35.53 kg/m     Wt Readings from Last 3 Encounters:  11/26/19 262 lb (118.8 kg)  08/19/19 263 lb (119.3 kg)  12/19/16 263 lb 11.2 oz (119.6 kg)     GEN:  Well nourished, well developed in no acute distress HEENT: Normal NECK: No JVD; No carotid bruits LYMPHATICS: No lymphadenopathy CARDIAC: S1S2 noted,RRR, no murmurs, rubs, gallops RESPIRATORY:  Clear to auscultation without rales, wheezing or rhonchi  ABDOMEN: Soft, non-tender, non-distended, +bowel sounds, no guarding. EXTREMITIES: No edema, No cyanosis, no clubbing MUSCULOSKELETAL:  No deformity  SKIN: Warm and dry NEUROLOGIC:  Alert and oriented x 3, non-focal PSYCHIATRIC:  Normal affect, good insight  ASSESSMENT:    1. Mild CAD   2. Hyperlipidemia, unspecified hyperlipidemia type   3. Medication management   4. Obesity (BMI 30-39.9)    PLAN:    1.  CAD continue patient's current aspirin 81 mg daily as well as Lipitor 20 mg daily.  His coronary CTA does show mild plaque in the LAD (25 to 49%).  Doubt that this is the source of his chest pain. I suspect that anxiety may be may playing a role. In the meantime, since his study CCTA was not previously sent for FFR due to mild lesion. For completeness, I will send this study for FFRct today to  assess flow.  2. Hyperlipdemia- continue patient on Lipitor 20 mg daily. Lipid profile will be done today.   3.  Hypertension - blood pressure is acceptable today.    4. Obesity - the patient is going to continue to exercise daily.   The patient is in agreement with the above plan. The patient left the office in stable condition.  The patient will follow up in 6 months.    Medication Adjustments/Labs and Tests Ordered: Current medicines are reviewed at length with the patient today.  Concerns regarding medicines are outlined above.  Orders Placed This Encounter  Procedures  . Lipid Profile   No orders of the defined types were placed in this encounter.   Patient Instructions  Medication Instructions:  Your physician recommends that you continue on your current medications as directed. Please refer to the Current Medication list given to you today.  *If you need a refill on your cardiac medications before your next appointment, please call your pharmacy*   Lab Work: Today: Lipid profile  If you have labs (blood work) drawn today and your tests are completely normal, you will receive your results only by: Marland Kitchen MyChart Message (if you have MyChart) OR . A paper copy in the mail If you have any lab test that is abnormal or we need to change your treatment, we will call you to review the results.   Testing/Procedures: None ordered   Follow-Up: At [facility], you and your health needs are our priority.  As part of our continuing mission to provide you with exceptional heart care, we have created designated Provider Care Teams.  These Care Teams include your primary Cardiologist (physician) and Advanced Practice Providers (APPs -  Physician Assistants and Nurse Practitioners) who all work together to provide you with the care you need, when you need it.  We recommend signing up for the patient portal called "MyChart".  Sign up information is provided on this After Visit Summary.   MyChart is used to connect with patients for Virtual Visits (Telemedicine).  Patients are able to view lab/test results, encounter notes, upcoming appointments, etc.  Non-urgent messages can be sent to your provider as well.   To learn more about what you can do with MyChart, go to [email or web address].    Your next appointment:   6 month(s)  The format for your next appointment:   In Person  Provider:   Berniece Salines, DO   Other Instructions      Adopting a Healthy Lifestyle.  Know what a healthy weight is for you (roughly BMI <25) and aim to maintain this   Aim for 7+ servings of fruits and vegetables daily   65-80+ fluid ounces of water or unsweet tea for healthy kidneys   Limit to max 1 drink of alcohol per day; avoid smoking/tobacco   Limit animal fats in diet for cholesterol and heart health - choose grass fed whenever available   Avoid highly processed foods, and foods high in saturated/trans fats   Aim for low stress - take time to unwind and care for your mental health   Aim for 150 min of moderate intensity exercise weekly for heart health, and weights twice weekly for bone health   Aim for 7-9 hours of sleep daily   When it comes to diets, agreement about the perfect plan isnt easy to find, even among the experts. Experts at the [employer] developed an idea known as the Healthy Eating Plate. Just imagine a plate divided into logical, healthy portions.   The emphasis is on diet quality:   Load up on vegetables and fruits - one-half of your plate: Aim for color and variety, and remember that potatoes dont count.   Go for whole grains - one-quarter of your plate: Whole wheat, barley, wheat berries, quinoa, oats, brown rice, and foods made with them. If you want pasta, go with whole wheat pasta.   Protein power - one-quarter of your plate: Fish, chicken, beans, and nuts are all healthy, versatile protein sources. Limit red meat.   The  diet, however, does go beyond the plate, offering a few other suggestions.   Use healthy plant oils, such as olive, canola, soy, corn, sunflower and peanut. Check the labels, and avoid partially hydrogenated oil, which have unhealthy trans fats.   If youre thirsty, drink water. Coffee and tea are good in moderation, but skip sugary drinks and limit milk and dairy products to one or two daily servings.   The type of carbohydrate in the diet is more important than the amount. Some sources of carbohydrates, such as vegetables, fruits, whole grains, and beans-are healthier than others.   Finally, stay active  Signed, Berniece Salines, DO  11/26/2019 12:11 PM    [facility]

## 2019-11-26 NOTE — Patient Instructions (Signed)
Medication Instructions:  Your physician recommends that you continue on your current medications as directed. Please refer to the Current Medication list given to you today.  *If you need a refill on your cardiac medications before your next appointment, please call your pharmacy*   Lab Work: Today: Lipid profile  If you have labs (blood work) drawn today and your tests are completely normal, you will receive your results only by: Marland Kitchen MyChart Message (if you have MyChart) OR . A paper copy in the mail If you have any lab test that is abnormal or we need to change your treatment, we will call you to review the results.   Testing/Procedures: None ordered   Follow-Up: At [facility], you and your health needs are our priority.  As part of our continuing mission to provide you with exceptional heart care, we have created designated Provider Care Teams.  These Care Teams include your primary Cardiologist (physician) and Advanced Practice Providers (APPs -  Physician Assistants and Nurse Practitioners) who all work together to provide you with the care you need, when you need it.  We recommend signing up for the patient portal called "MyChart".  Sign up information is provided on this After Visit Summary.  MyChart is used to connect with patients for Virtual Visits (Telemedicine).  Patients are able to view lab/test results, encounter notes, upcoming appointments, etc.  Non-urgent messages can be sent to your provider as well.   To learn more about what you can do with MyChart, go to [email or web address].    Your next appointment:   6 month(s)  The format for your next appointment:   In Person  Provider:   Thomasene Ripple, DO   Other Instructions

## 2019-11-27 LAB — LIPID PANEL
Chol/HDL Ratio: 2.4 ratio (ref 0.0–5.0)
Cholesterol, Total: 152 mg/dL (ref 100–199)
HDL: 64 mg/dL (ref >39)
LDL Chol Calc (NIH): 73 mg/dL (ref 0–99)
Triglycerides: 75 mg/dL (ref 0–149)
VLDL Cholesterol Cal: 15 mg/dL (ref 5–40)

## 2019-12-01 DIAGNOSIS — I2510 Atherosclerotic heart disease of native coronary artery without angina pectoris: Secondary | ICD-10-CM

## 2020-02-19 ENCOUNTER — Ambulatory Visit (INDEPENDENT_AMBULATORY_CARE_PROVIDER_SITE_OTHER): Payer: 59 | Admitting: Cardiology

## 2020-02-19 ENCOUNTER — Encounter: Payer: Self-pay | Admitting: Cardiology

## 2020-02-19 ENCOUNTER — Other Ambulatory Visit: Payer: Self-pay

## 2020-02-19 VITALS — BP 124/78 | HR 100 | Ht 72.0 in | Wt 261.0 lb

## 2020-02-19 DIAGNOSIS — I10 Essential (primary) hypertension: Secondary | ICD-10-CM | POA: Diagnosis not present

## 2020-02-19 DIAGNOSIS — E669 Obesity, unspecified: Secondary | ICD-10-CM | POA: Diagnosis not present

## 2020-02-19 DIAGNOSIS — I2510 Atherosclerotic heart disease of native coronary artery without angina pectoris: Secondary | ICD-10-CM

## 2020-02-19 DIAGNOSIS — E782 Mixed hyperlipidemia: Secondary | ICD-10-CM | POA: Diagnosis not present

## 2020-02-19 MED ORDER — RANOLAZINE ER 500 MG PO TB12: 500.0000 mg | ORAL_TABLET | Freq: Two times a day (BID) | ORAL | 3 refills | Status: DC

## 2020-02-19 NOTE — Patient Instructions (Signed)
Medication Instructions: Start Ranolazine (Ranexa) 500 mg twice a day   *If you need a refill on your cardiac medications before your next appointment, please call your pharmacy*   Lab Work: None ordered   If you have labs (blood work) drawn today and your tests are completely normal, you will receive your results only by: Marland Kitchen MyChart Message (if you have MyChart) OR . A paper copy in the mail If you have any lab test that is abnormal or we need to change your treatment, we will call you to review the results.   Testing/Procedures: None ordered    Follow-Up: At [facility], you and your health needs are our priority.  As part of our continuing mission to provide you with exceptional heart care, we have created designated Provider Care Teams.  These Care Teams include your primary Cardiologist (physician) and Advanced Practice Providers (APPs -  Physician Assistants and Nurse Practitioners) who all work together to provide you with the care you need, when you need it.  We recommend signing up for the patient portal called "MyChart".  Sign up information is provided on this After Visit Summary.  MyChart is used to connect with patients for Virtual Visits (Telemedicine).  Patients are able to view lab/test results, encounter notes, upcoming appointments, etc.  Non-urgent messages can be sent to your provider as well.   To learn more about what you can do with MyChart, go to [email or web address].    Your next appointment:   6 month(s)  The format for your next appointment:   In Person  Provider:   Thomasene Ripple, DO   Other Instructions None

## 2020-02-19 NOTE — Progress Notes (Signed)
Cardiology Office Note:    Date:  02/19/2020   ID:  Charles LovelyWilliam G Barge, DOB Jan 15, 1971, MRN [id number]  PCP:  Lonie Peakonroy, Nathan, PA-C  Cardiologist:  Thomasene RippleKardie Kendell Sagraves, DO  Electrophysiologist:  None   Referring MD: Lonie Peakonroy, Nathan, PA-C   Chief Complaint  Patient presents with  . Follow-up   History of Present Illness:    Charles Goodman is a 49 y.o. male with a hx of mild coronary artery disease, hypertension, hyperlipidemia presents today for follow-up visit.  Last saw the patient on April for chest pain.  Reviewed his coronary CTA and also send this for FFR analysis we should was mostly normal with 0.74 in the small distal D1.  At that time shared decision patient wanted to only use nitroglycerin as needed and that he did not want to be placed on a long-acting nitrate which was reasonable.  Today he is here for follow-up visit.  He still is experiencing chest pain is using nitro twice but stopped using this due to significant headache.  He also reported that he was started on as needed Xanax by his PCP as well.   Past Medical History:  Diagnosis Date  . 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 Gout   . Hyperlipidemia   . Hypertension   . Medication management 11/26/2019  . Mild CAD 11/26/2019  . Obesity (BMI 30-39.9) 11/26/2019    No past surgical history on file.  Current Medications: Current Meds  Medication Sig  . allopurinol (ZYLOPRIM) 100 MG tablet Take 1 tablet by mouth daily.  Marland Kitchen. aspirin EC 81 MG tablet Take 1 tablet (81 mg total) by mouth daily.  Marland Kitchen. atorvastatin (LIPITOR) 20 MG tablet Take 1 tablet (20 mg total) by mouth daily.  Marland Kitchen. lisinopril (PRINIVIL,ZESTRIL) 10 MG tablet Take 1 tablet by mouth daily.  . nitroGLYCERIN (NITROSTAT) 0.4 MG SL tablet Place 1 tablet (0.4 mg total) under the tongue every 5 (five) minutes as needed.     Allergies:   Patient has no known allergies.   Social History   Socioeconomic History  . Marital status: Married    Spouse name: Not on  file  . Number of children: Not on file  . Years of education: Not on file  . Highest education level: Not on file  Occupational History  . Not on file  Tobacco Use  . Smoking status: Never Smoker  . Smokeless tobacco: Never Used  Substance and Sexual Activity  . Alcohol use: Not on file  . Drug use: Not on file  . Sexual activity: Not on file  Other Topics Concern  . Not on file  Social History Narrative  . Not on file   Social Determinants of Health   Financial Resource Strain:   . Difficulty of Paying Living Expenses:   Food Insecurity:   . Worried About Programme researcher, broadcasting/film/videounning Out of Food in the Last Year:   . Baristaan Out of Food in the Last Year:   Transportation Needs:   . Freight forwarderLack of Transportation (Medical):   Marland Kitchen. Lack of Transportation (Non-Medical):   Physical Activity:   . Days of Exercise per Week:   . Minutes of Exercise per Session:   Stress:   . Feeling of Stress :   Social Connections:   . Frequency of Communication with Friends and Family:   . Frequency of Social Gatherings with Friends and Family:   . Attends Religious Services:   . Active Member of Clubs or Organizations:   . Attends Club  or Organization Meetings:   Marland Kitchen Marital Status:      Family History: The patient's family history includes High blood pressure in his father.  ROS:   Review of Systems  Constitution: Negative for decreased appetite, fever and weight gain.  HENT: Negative for congestion, ear discharge, hoarse voice and sore throat.   Eyes: Negative for discharge, redness, vision loss in right eye and visual halos.  Cardiovascular: Negative for chest pain, dyspnea on exertion, leg swelling, orthopnea and palpitations.  Respiratory: Negative for cough, hemoptysis, shortness of breath and snoring.   Endocrine: Negative for heat intolerance and polyphagia.  Hematologic/Lymphatic: Negative for bleeding problem. Does not bruise/bleed easily.  Skin: Negative for flushing, nail changes, rash and suspicious  lesions.  Musculoskeletal: Negative for arthritis, joint pain, muscle cramps, myalgias, neck pain and stiffness.  Gastrointestinal: Negative for abdominal pain, bowel incontinence, diarrhea and excessive appetite.  Genitourinary: Negative for decreased libido, genital sores and incomplete emptying.  Neurological: Negative for brief paralysis, focal weakness, headaches and loss of balance.  Psychiatric/Behavioral: Negative for altered mental status, depression and suicidal ideas.  Allergic/Immunologic: Negative for HIV exposure and persistent infections.    EKGs/Labs/Other Studies Reviewed:    The following studies were reviewed today:   EKG:  The ekg ordered today demonstrates   Recent Labs: No results found for requested labs within last 8760 hours.  Recent Lipid Panel    Component Value Date/Time   CHOL 152 11/26/2019 1156   TRIG 75 11/26/2019 1156   HDL 64 11/26/2019 1156   CHOLHDL 2.4 11/26/2019 1156   LDLCALC 73 11/26/2019 1156    Physical Exam:    VS:  BP 124/78   Pulse 100   Ht 6' (1.829 m)   Wt 261 lb (118.4 kg)   SpO2 98%   BMI 35.40 kg/m     Wt Readings from Last 3 Encounters:  02/19/20 261 lb (118.4 kg)  11/26/19 262 lb (118.8 kg)  08/19/19 263 lb (119.3 kg)     GEN: Well nourished, well developed in no acute distress HEENT: Normal NECK: No JVD; No carotid bruits LYMPHATICS: No lymphadenopathy CARDIAC: S1S2 noted,RRR, no murmurs, rubs, gallops RESPIRATORY:  Clear to auscultation without rales, wheezing or rhonchi  ABDOMEN: Soft, non-tender, non-distended, +bowel sounds, no guarding. EXTREMITIES: No edema, No cyanosis, no clubbing MUSCULOSKELETAL:  No deformity  SKIN: Warm and dry NEUROLOGIC:  Alert and oriented x 3, non-focal PSYCHIATRIC:  Normal affect, good insight  ASSESSMENT:    1. Essential hypertension   2. Mild CAD   3. Obesity (BMI 30-39.9)   4. Mixed hyperlipidemia    PLAN:     1.  Coronary artery disease with atypical angina-I  discussed with the patient it would be clinically appropriate to start him on Ranexa 500 mg twice daily to see including any changes in his symptoms.  He has reluctantly agreed - but tells me he will probably stop this medication at his next chest pain.  Continue patient aspirin and Lipitor.  2. Hypertension -blood pressure was unremarkable in the office today.  I will continue him on his lisinopril 10 mg daily.  3.  Obesity-the patient understands the need to lose weight with diet and exercise. We have discussed specific strategies for this.  4. Hyperlipidemia - continue patient on lipitor.  The patient is in agreement with the above plan. The patient left the office in stable condition.  The patient will follow up in 6 months or sooner.    Medication Adjustments/Labs and  Tests Ordered: Current medicines are reviewed at length with the patient today.  Concerns regarding medicines are outlined above.  No orders of the defined types were placed in this encounter.  Meds ordered this encounter  Medications  . ranolazine (RANEXA) 500 MG 12 hr tablet    Sig: Take 1 tablet (500 mg total) by mouth 2 (two) times daily.    Dispense:  180 tablet    Refill:  3    Patient Instructions  Medication Instructions: Start Ranolazine (Ranexa) 500 mg twice a day   *If you need a refill on your cardiac medications before your next appointment, please call your pharmacy*   Lab Work: None ordered   If you have labs (blood work) drawn today and your tests are completely normal, you will receive your results only by: Marland Kitchen MyChart Message (if you have MyChart) OR . A paper copy in the mail If you have any lab test that is abnormal or we need to change your treatment, we will call you to review the results.   Testing/Procedures: None ordered    Follow-Up: At [facility], you and your health needs are our priority.  As part of our continuing mission to provide you with exceptional heart care, we have  created designated Provider Care Teams.  These Care Teams include your primary Cardiologist (physician) and Advanced Practice Providers (APPs -  Physician Assistants and Nurse Practitioners) who all work together to provide you with the care you need, when you need it.  We recommend signing up for the patient portal called "MyChart".  Sign up information is provided on this After Visit Summary.  MyChart is used to connect with patients for Virtual Visits (Telemedicine).  Patients are able to view lab/test results, encounter notes, upcoming appointments, etc.  Non-urgent messages can be sent to your provider as well.   To learn more about what you can do with MyChart, go to [email or web address].    Your next appointment:   6 month(s)  The format for your next appointment:   In Person  Provider:   Thomasene Ripple, DO   Other Instructions None      Adopting a Healthy Lifestyle.  Know what a healthy weight is for you (roughly BMI <25) and aim to maintain this   Aim for 7+ servings of fruits and vegetables daily   65-80+ fluid ounces of water or unsweet tea for healthy kidneys   Limit to max 1 drink of alcohol per day; avoid smoking/tobacco   Limit animal fats in diet for cholesterol and heart health - choose grass fed whenever available   Avoid highly processed foods, and foods high in saturated/trans fats   Aim for low stress - take time to unwind and care for your mental health   Aim for 150 min of moderate intensity exercise weekly for heart health, and weights twice weekly for bone health   Aim for 7-9 hours of sleep daily   When it comes to diets, agreement about the perfect plan isnt easy to find, even among the experts. Experts at the [facility] [employer] developed an idea known as the Healthy Eating Plate. Just imagine a plate divided into logical, healthy portions.   The emphasis is on diet quality:   Load up on vegetables and fruits - one-half of your  plate: Aim for color and variety, and remember that potatoes dont count.   Go for whole grains - one-quarter of your plate: Whole wheat, barley, wheat berries, quinoa,  oats, brown rice, and foods made with them. If you want pasta, go with whole wheat pasta.   Protein power - one-quarter of your plate: Fish, chicken, beans, and nuts are all healthy, versatile protein sources. Limit red meat.   The diet, however, does go beyond the plate, offering a few other suggestions.   Use healthy plant oils, such as olive, canola, soy, corn, sunflower and peanut. Check the labels, and avoid partially hydrogenated oil, which have unhealthy trans fats.   If youre thirsty, drink water. Coffee and tea are good in moderation, but skip sugary drinks and limit milk and dairy products to one or two daily servings.   The type of carbohydrate in the diet is more important than the amount. Some sources of carbohydrates, such as vegetables, fruits, whole grains, and beans-are healthier than others.   Finally, stay active  Signed, Thomasene Ripple, DO  02/19/2020 1:46 PM    [facility]

## 2020-09-01 DIAGNOSIS — M109 Gout, unspecified: Secondary | ICD-10-CM | POA: Insufficient documentation

## 2020-09-02 ENCOUNTER — Other Ambulatory Visit: Payer: Self-pay

## 2020-09-02 ENCOUNTER — Ambulatory Visit (INDEPENDENT_AMBULATORY_CARE_PROVIDER_SITE_OTHER): Payer: 59 | Admitting: Cardiology

## 2020-09-02 ENCOUNTER — Encounter: Payer: Self-pay | Admitting: Cardiology

## 2020-09-02 VITALS — BP 124/66 | HR 81 | Ht 72.0 in | Wt 269.2 lb

## 2020-09-02 DIAGNOSIS — I10 Essential (primary) hypertension: Secondary | ICD-10-CM | POA: Diagnosis not present

## 2020-09-02 DIAGNOSIS — E782 Mixed hyperlipidemia: Secondary | ICD-10-CM | POA: Diagnosis not present

## 2020-09-02 DIAGNOSIS — I2510 Atherosclerotic heart disease of native coronary artery without angina pectoris: Secondary | ICD-10-CM

## 2020-09-02 DIAGNOSIS — E669 Obesity, unspecified: Secondary | ICD-10-CM

## 2020-09-02 NOTE — Progress Notes (Signed)
Cardiology Office Note:    Date:  09/02/2020   ID:  Maurilio Lovely, DOB 04-10-1971, MRN [id number]  PCP:  Lonie Peak, PA-C  Cardiologist:  Thomasene Ripple, DO  Electrophysiologist:  None   Referring MD: Lonie Peak, PA-C  I am doing fine  History of Present Illness:    Charles Goodman is a 50 y.o. male with a hx of mild coronary artery disease, hypertension, hyperlipidemia is here today for follow-up visit.Last saw the patient on April for chest pain.  Reviewed his coronary CTA and also send this for FFR analysis we should was mostly normal with 0.74 in the small distal D1.  At that time shared decision patient wanted to only use nitroglycerin as needed and that he did not want to be placed on a long-acting nitrate which was reasonable.  I then saw the patient on February 19, 2020 at that time we discussed starting on Ranexa 500 mg twice a day.  He tells me since he last he had not started this medication he barely has chest pain up until may be 2 weeks ago when he had a very mild left-sided pain and resolved without any intervention.  Past Medical History:  Diagnosis Date  . 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 Gout   . Hyperlipidemia   . Hypertension   . Medication management 11/26/2019  . Mild CAD 11/26/2019  . Obesity (BMI 30-39.9) 11/26/2019    History reviewed. No pertinent surgical history.  Current Medications: Current Meds  Medication Sig  . allopurinol (ZYLOPRIM) 100 MG tablet Take 1 tablet by mouth daily.  Marland Kitchen aspirin EC 81 MG tablet Take 1 tablet (81 mg total) by mouth daily.  Marland Kitchen atorvastatin (LIPITOR) 20 MG tablet Take 1 tablet (20 mg total) by mouth daily.  Marland Kitchen lisinopril (PRINIVIL,ZESTRIL) 10 MG tablet Take 1 tablet by mouth daily.  . ranolazine (RANEXA) 500 MG 12 hr tablet Take 1 tablet (500 mg total) by mouth 2 (two) times daily.     Allergies:   Patient has no known allergies.   Social History   Socioeconomic History  . Marital status: Married     Spouse name: Not on file  . Number of children: Not on file  . Years of education: Not on file  . Highest education level: Not on file  Occupational History  . Not on file  Tobacco Use  . Smoking status: Never Smoker  . Smokeless tobacco: Never Used  Substance and Sexual Activity  . Alcohol use: Not on file  . Drug use: Not on file  . Sexual activity: Not on file  Other Topics Concern  . Not on file  Social History Narrative  . Not on file   Social Determinants of Health   Financial Resource Strain: Not on file  Food Insecurity: Not on file  Transportation Needs: Not on file  Physical Activity: Not on file  Stress: Not on file  Social Connections: Not on file     Family History: The patient's family history includes High blood pressure in his father.  ROS:   Review of Systems  Constitution: Negative for decreased appetite, fever and weight gain.  HENT: Negative for congestion, ear discharge, hoarse voice and sore throat.   Eyes: Negative for discharge, redness, vision loss in right eye and visual halos.  Cardiovascular: Negative for chest pain, dyspnea on exertion, leg swelling, orthopnea and palpitations.  Respiratory: Negative for cough, hemoptysis, shortness of breath and snoring.   Endocrine: Negative for  heat intolerance and polyphagia.  Hematologic/Lymphatic: Negative for bleeding problem. Does not bruise/bleed easily.  Skin: Negative for flushing, nail changes, rash and suspicious lesions.  Musculoskeletal: Negative for arthritis, joint pain, muscle cramps, myalgias, neck pain and stiffness.  Gastrointestinal: Negative for abdominal pain, bowel incontinence, diarrhea and excessive appetite.  Genitourinary: Negative for decreased libido, genital sores and incomplete emptying.  Neurological: Negative for brief paralysis, focal weakness, headaches and loss of balance.  Psychiatric/Behavioral: Negative for altered mental status, depression and suicidal ideas.   Allergic/Immunologic: Negative for HIV exposure and persistent infections.    EKGs/Labs/Other Studies Reviewed:    The following studies were reviewed today:   EKG:  The ekg ordered today demonstrates sinus rhythm, heart rate 81 bpm with R wave progression suggesting old septal infarction and T wave morphology suggesting left atrial enlargement.  Recent Labs: No results found for requested labs within last 8760 hours.  Recent Lipid Panel    Component Value Date/Time   CHOL 152 11/26/2019 1156   TRIG 75 11/26/2019 1156   HDL 64 11/26/2019 1156   CHOLHDL 2.4 11/26/2019 1156   LDLCALC 73 11/26/2019 1156    Physical Exam:    VS:  BP 124/66   Pulse 81   Ht 6' (1.829 m)   Wt 269 lb 3.2 oz (122.1 kg)   SpO2 96%   BMI 36.51 kg/m     Wt Readings from Last 3 Encounters:  09/02/20 269 lb 3.2 oz (122.1 kg)  02/19/20 261 lb (118.4 kg)  11/26/19 262 lb (118.8 kg)     GEN: Well nourished, well developed in no acute distress HEENT: Normal NECK: No JVD; No carotid bruits LYMPHATICS: No lymphadenopathy CARDIAC: S1S2 noted,RRR, no murmurs, rubs, gallops RESPIRATORY:  Clear to auscultation without rales, wheezing or rhonchi  ABDOMEN: Soft, non-tender, non-distended, +bowel sounds, no guarding. EXTREMITIES: No edema, No cyanosis, no clubbing MUSCULOSKELETAL:  No deformity  SKIN: Warm and dry NEUROLOGIC:  Alert and oriented x 3, non-focal PSYCHIATRIC:  Normal affect, good insight  ASSESSMENT:    1. Essential hypertension   2. Mild CAD   3. Mixed hyperlipidemia   4. Obesity (BMI 30-39.9)   5. Primary hypertension    PLAN:     1.  He is stable from a coronary artery disease standpoint.  He has not started his Ranexa he does have this medication.  I advised him if chest pain comes and become persistent we can start antianginals and see how that does.  2 blood pressure is acceptable, continue with current antihypertensive regimen.  3. hyperlipidemia - continue with current  statin medication.  4. the patient understands the need to lose weight with diet and exercise. We have discussed specific strategies for this.  The patient is in agreement with the above plan. The patient left the office in stable condition.  The patient will follow up in 1 year    Medication Adjustments/Labs and Tests Ordered: Current medicines are reviewed at length with the patient today.  Concerns regarding medicines are outlined above.  Orders Placed This Encounter  Procedures  . EKG 12-Lead   No orders of the defined types were placed in this encounter.   Patient Instructions  Medication Instructions:  Your physician recommends that you continue on your current medications as directed. Please refer to the Current Medication list given to you today.  *If you need a refill on your cardiac medications before your next appointment, please call your pharmacy*   Lab Work: NONE If you have  labs (blood work) drawn today and your tests are completely normal, you will receive your results only by: Marland Kitchen. MyChart Message (if you have MyChart) OR . A paper copy in the mail If you have any lab test that is abnormal or we need to change your treatment, we will call you to review the results.   Testing/Procedures: NONE   Follow-Up: At [facility], you and your health needs are our priority.  As part of our continuing mission to provide you with exceptional heart care, we have created designated Provider Care Teams.  These Care Teams include your primary Cardiologist (physician) and Advanced Practice Providers (APPs -  Physician Assistants and Nurse Practitioners) who all work together to provide you with the care you need, when you need it.  We recommend signing up for the patient portal called "MyChart".  Sign up information is provided on this After Visit Summary.  MyChart is used to connect with patients for Virtual Visits (Telemedicine).  Patients are able to view lab/test results,  encounter notes, upcoming appointments, etc.  Non-urgent messages can be sent to your provider as well.   To learn more about what you can do with MyChart, go to [email or web address].    Your next appointment:   1 year(s)  The format for your next appointment:   In Person  Provider:   Thomasene RippleKardie Akasia Ahmad, DO   Other Instructions      Adopting a Healthy Lifestyle.  Know what a healthy weight is for you (roughly BMI <25) and aim to maintain this   Aim for 7+ servings of fruits and vegetables daily   65-80+ fluid ounces of water or unsweet tea for healthy kidneys   Limit to max 1 drink of alcohol per day; avoid smoking/tobacco   Limit animal fats in diet for cholesterol and heart health - choose grass fed whenever available   Avoid highly processed foods, and foods high in saturated/trans fats   Aim for low stress - take time to unwind and care for your mental health   Aim for 150 min of moderate intensity exercise weekly for heart health, and weights twice weekly for bone health   Aim for 7-9 hours of sleep daily   When it comes to diets, agreement about the perfect plan isnt easy to find, even among the experts. Experts at the [employer] [employer] [facility] [employer] developed an idea known as the Healthy Eating Plate. Just imagine a plate divided into logical, healthy portions.   The emphasis is on diet quality:   Load up on vegetables and fruits - one-half of your plate: Aim for color and variety, and remember that potatoes dont count.   Go for whole grains - one-quarter of your plate: Whole wheat, barley, wheat berries, quinoa, oats, brown rice, and foods made with them. If you want pasta, go with whole wheat pasta.   Protein power - one-quarter of your plate: Fish, chicken, beans, and nuts are all healthy, versatile protein sources. Limit red meat.   The diet, however, does go beyond the plate, offering a few other suggestions.   Use healthy plant oils, such as olive,  canola, soy, corn, sunflower and peanut. Check the labels, and avoid partially hydrogenated oil, which have unhealthy trans fats.   If youre thirsty, drink water. Coffee and tea are good in moderation, but skip sugary drinks and limit milk and dairy products to one or two daily servings.   The type of carbohydrate in the diet is more important  than the amount. Some sources of carbohydrates, such as vegetables, fruits, whole grains, and beans-are healthier than others.   Finally, stay active  Signed, Thomasene Ripple, DO  09/02/2020 12:17 PM    [facility]

## 2020-09-02 NOTE — Patient Instructions (Signed)

## 2021-12-26 ENCOUNTER — Encounter: Payer: Self-pay | Admitting: Cardiology

## 2021-12-26 ENCOUNTER — Ambulatory Visit (INDEPENDENT_AMBULATORY_CARE_PROVIDER_SITE_OTHER): Payer: 59 | Admitting: Cardiology

## 2021-12-26 VITALS — BP 126/96 | HR 84 | Ht 72.0 in | Wt 281.6 lb

## 2021-12-26 DIAGNOSIS — E669 Obesity, unspecified: Secondary | ICD-10-CM

## 2021-12-26 DIAGNOSIS — I10 Essential (primary) hypertension: Secondary | ICD-10-CM | POA: Diagnosis not present

## 2021-12-26 DIAGNOSIS — R002 Palpitations: Secondary | ICD-10-CM

## 2021-12-26 DIAGNOSIS — E782 Mixed hyperlipidemia: Secondary | ICD-10-CM

## 2021-12-26 DIAGNOSIS — I2510 Atherosclerotic heart disease of native coronary artery without angina pectoris: Secondary | ICD-10-CM

## 2021-12-26 NOTE — Patient Instructions (Addendum)
Medication Instructions:  ?Your physician recommends that you continue on your current medications as directed. Please refer to the Current Medication list given to you today.  ?*If you need a refill on your cardiac medications before your next appointment, please call your pharmacy* ? ? ?Lab Work: ?None ?If you have labs (blood work) drawn today and your tests are completely normal, you will receive your results only by: ?MyChart Message (if you have MyChart) OR ?A paper copy in the mail ?If you have any lab test that is abnormal or we need to change your treatment, we will call you to review the results. ? ? ?Testing/Procedures: ?None ? ? ?Follow-Up: ?At [facility], you and your health needs are our priority.  As part of our continuing mission to provide you with exceptional heart care, we have created designated Provider Care Teams.  These Care Teams include your primary Cardiologist (physician) and Advanced Practice Providers (APPs -  Physician Assistants and Nurse Practitioners) who all work together to provide you with the care you need, when you need it. ? ?We recommend signing up for the patient portal called "MyChart".  Sign up information is provided on this After Visit Summary.  MyChart is used to connect with patients for Virtual Visits (Telemedicine).  Patients are able to view lab/test results, encounter notes, upcoming appointments, etc.  Non-urgent messages can be sent to your provider as well.   ?To learn more about what you can do with MyChart, go to [email or web address].   ? ?Your next appointment:   ?1 year(s) ? ?The format for your next appointment:   ?In Person ? ?Provider:   ?Thomasene Ripple, DO   ? ? ?Other Instructions ? ?KardiaMobile [email or web address] ? ? ? ? ? ? ? ?FDA-cleared, clinical grade mobile EKG monitor: Lourena Simmonds is the most clinically-validated mobile EKG used by the world's leading cardiac care medical professionals With Basic service, know  instantly if your heart rhythm is normal or if atrial fibrillation is detected, and email the last single EKG recording to yourself or your doctor Premium service, available for purchase through the Kardia app for $9.99 per month or $99 per year, includes unlimited history and storage of your EKG recordings, a monthly EKG summary report to share with your doctor, along with the ability to track your blood pressure, activity and weight Includes one KardiaMobile phone clip FREE SHIPPING: Standard delivery 1-3 business days. Orders placed by 11:00am PST will ship that afternoon. Otherwise, will ship next business day. All orders ship via [employer] from [facility], [place] ? ?  ?[employer] - sending an EKG ?Download app and set up profile. ?Run EKG - by placing 1-2 fingers on the silver plates ?After EKG is complete - Download PDF  ?- Skip password (if you apply a password the provider will need it to view the EKG) ?Click share button (square with upward arrow) in bottom left corner ?To send: choose MyChart (first time log into MyChart)  ?Pop up window about sending ECG ?Click continue ?Choose type of message ?Choose provider ?Type subject and message ?Click send (EKG should be attached)  ?- To send additional EKGs in one message click the paperclip image and bottom of page to attach.   ? ?Important Information About [employer] ? ? ? ? ?  ?

## 2021-12-26 NOTE — Progress Notes (Signed)
?Cardiology Office Note:   ? ?Date:  12/26/2021  ? ?ID:  Charles Goodman, DOB [birth year], MRN [id number] ? ?PCP:  Lonie Peak, PA-C  ?Cardiologist:  Thomasene Ripple, DO  ?Electrophysiologist:  None  ? ?Referring MD: Lonie Peak, PA-C  ? ?" I am doing well" ? ?History of Present Illness:   ? ?Charles Goodman is a 51 y.o. male with a hx of mild coronary artery disease, hypertension, hyperlipidemia is here today for follow-up visit. ? ?I saw the patient on 09/02/2020 at that time he was experiencing intermittent chest discomfort. At that time discussed starting when he was seen.  Since I saw the patient the chest pain has not gotten worse to do so. ? ?He has not needed to start for any seizures.  But he does tell me he is getting new symptoms of fluttering which is abrupt intermittent and does not last long.  No shortness of breath. ? ?No other complaints today. ? ?Past Medical History:  ?Diagnosis Date  ? 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 Gout   ? Hyperlipidemia   ? Hypertension   ? Medication management 11/26/2019  ? Mild CAD 11/26/2019  ? Obesity (BMI 30-39.9) 11/26/2019  ? ? ?No past surgical history on file. ? ?Current Medications: ?Current Meds  ?Medication Sig  ? allopurinol (ZYLOPRIM) 100 MG tablet Take 1 tablet by mouth daily.  ? aspirin EC 81 MG tablet Take 1 tablet (81 mg total) by mouth daily.  ? atorvastatin (LIPITOR) 20 MG tablet Take 1 tablet (20 mg total) by mouth daily.  ? lisinopril (PRINIVIL,ZESTRIL) 10 MG tablet Take 1 tablet by mouth daily.  ? Multiple Vitamin (MULTIVITAMIN PO) Take by mouth daily.  ? nitroGLYCERIN (NITROSTAT) 0.4 MG SL tablet Place 1 tablet (0.4 mg total) under the tongue every 5 (five) minutes as needed.  ? Omega-3 Fatty Acids (FISH OIL PO) Take by mouth daily.  ?  ? ?Allergies:   Patient has no known allergies.  ? ?Social History  ? ?Socioeconomic History  ? Marital status: Married  ?  Spouse name: Not on file  ? Number of children: Not on file  ? Years of education: Not  on file  ? Highest education level: Not on file  ?Occupational History  ? Not on file  ?Tobacco Use  ? Smoking status: Never  ? Smokeless tobacco: Never  ?Substance and Sexual Activity  ? Alcohol use: Not on file  ? Drug use: Not on file  ? Sexual activity: Not on file  ?Other Topics Concern  ? Not on file  ?Social History Narrative  ? Not on file  ? ?Social Determinants of Health  ? ?Financial Resource Strain: Not on file  ?Food Insecurity: Not on file  ?Transportation Needs: Not on file  ?Physical Activity: Not on file  ?Stress: Not on file  ?Social Connections: Not on file  ?  ? ?Family History: ?The patient's family history includes High blood pressure in his father. ? ?ROS:   ?Review of Systems  ?Constitution: Negative for decreased appetite, fever and weight gain.  ?HENT: Negative for congestion, ear discharge, hoarse voice and sore throat.   ?Eyes: Negative for discharge, redness, vision loss in right eye and visual halos.  ?Cardiovascular: Negative for chest pain, dyspnea on exertion, leg swelling, orthopnea and palpitations.  ?Respiratory: Negative for cough, hemoptysis, shortness of breath and snoring.   ?Endocrine: Negative for heat intolerance and polyphagia.  ?Hematologic/Lymphatic: Negative for bleeding problem. Does not bruise/bleed easily.  ?Skin:  Negative for flushing, nail changes, rash and suspicious lesions.  ?Musculoskeletal: Negative for arthritis, joint pain, muscle cramps, myalgias, neck pain and stiffness.  ?Gastrointestinal: Negative for abdominal pain, bowel incontinence, diarrhea and excessive appetite.  ?Genitourinary: Negative for decreased libido, genital sores and incomplete emptying.  ?Neurological: Negative for brief paralysis, focal weakness, headaches and loss of balance.  ?Psychiatric/Behavioral: Negative for altered mental status, depression and suicidal ideas.  ?Allergic/Immunologic: Negative for HIV exposure and persistent infections.  ? ? ?EKGs/Labs/Other Studies Reviewed:    ? ?The following studies were reviewed today: ? ? ?EKG:  The ekg ordered today demonstrates sinus rhythm, heart rate 84 bpm with precordial leads suggestive of anterior wall infarction compared to prior EKG no significant change. ? ?Coronary CT scan December 01, 2019 ?Aorta: Normal size.  No calcifications.  No dissection. ?  ?Aortic Valve:  Trileaflet.  No calcifications. ?  ?Coronary Arteries:  Normal coronary origin.  Right dominance. ?  ?RCA is a large dominant artery that gives rise to PDA and PLVB. ?There is no plaque. ?  ?Left main is a large artery that gives rise to LAD and LCX arteries. ?  ?LAD is a large vessel with mild (25-49%) calcified plaque in the ?proximal portion of the vessel. Minimal calcified plaque in the mid ?portion of the vessel. There is no plaque in the distal portion of ?the vessel. D1 has a high take off and it is a medium caliber vessel ?with mild soft plaque in the proximal portion. D2 is a large caliber ?vessel with minimal mid vessel calcified plaque. ?  ?LCX is a non-dominant artery that gives rise to one large OM1 ?branch. There is no plaque. ?  ?Other findings: ?  ?Normal pulmonary vein drainage into the left atrium. ?  ?Normal left atrial appendage without a thrombus. ?  ?Normal size of the pulmonary artery. ?  ?IMPRESSION: ?1. Coronary calcium score of 79. This was 76 percentile for age and ?sex matched control. ?  ?2. Normal coronary origin with right dominance. ?  ?3. Mild Coronary Artery Disease. CAD-RADS 2. Aggressive medical therapy is recommended. ? ?  ? ?Transthoracic echocardiogram IMPRESSIONS October 21, 2019 ? 1. Left ventricular ejection fraction, by estimation, is 50 to 55%. The  ?left ventricle has low normal function. The left ventricle has no regional  ?wall motion abnormalities. Left ventricular diastolic parameters were  ?normal.  ? ?FINDINGS  ? Left Ventricle: Left ventricular ejection fraction, by estimation, is 50  ?to 55%. The left ventricle has low normal  function. The left ventricle has  ?no regional wall motion abnormalities. The left ventricular internal  ?cavity size was normal in size.  ?There is no left ventricular hypertrophy. Left ventricular diastolic  ?parameters were normal.  ? ?Right Ventricle: The right ventricular size is normal. No increase in  ?right ventricular wall thickness. Right ventricular systolic function is  ?normal. There is normal pulmonary artery systolic pressure. The tricuspid  ?regurgitant velocity is 1.74 m/s, and  ? with an assumed right atrial pressure of 3 mmHg, the estimated right  ?ventricular systolic pressure is 15.1 mmHg.  ? ?Left Atrium: Left atrial size was normal in size.  ? ?Right Atrium: Right atrial size was normal in size.  ? ?Pericardium: There is no evidence of pericardial effusion.  ? ?Mitral Valve: The mitral valve is normal in structure and function. Normal  ?mobility of the mitral valve leaflets. No evidence of mitral valve  ?regurgitation. No evidence of mitral valve stenosis.  ? ?Tricuspid  Valve: The tricuspid valve is normal in structure. Tricuspid  ?valve regurgitation is trivial. No evidence of tricuspid stenosis.  ? ?Aortic Valve: The aortic valve is normal in structure and function. Aortic  ?valve regurgitation is not visualized. No aortic stenosis is present.  ? ?Pulmonic Valve: The pulmonic valve was normal in structure. Pulmonic valve  ?regurgitation is not visualized. No evidence of pulmonic stenosis.  ? ?Aorta: The aortic root is normal in size and structure.  ? ?Venous: The inferior vena cava is normal in size with greater than 50%  ?respiratory variability, suggesting right atrial pressure of 3 mmHg.  ? ?IAS/Shunts: No atrial level shunt detected by color flow Doppler.  ? ?   ? ?Recent Labs: ?No results found for requested labs within last 8760 hours.  ?Recent Lipid Panel ?   ?Component Value Date/Time  ? CHOL 152 11/26/2019 1156  ? TRIG 75 11/26/2019 1156  ? HDL 64 11/26/2019 1156  ? CHOLHDL 2.4  11/26/2019 1156  ? LDLCALC 73 11/26/2019 1156  ? ? ?Physical Exam:   ? ?VS:  BP (!) 126/96   Pulse 84   Ht 6' (1.829 m)   Wt 281 lb 9.6 oz (127.7 kg)   SpO2 98%   BMI 38.19 kg/m?    ? ?Wt Readings from Last 3

## 2022-12-12 ENCOUNTER — Encounter: Payer: Self-pay | Admitting: *Deleted

## 2023-02-12 NOTE — Progress Notes (Unsigned)
Cardiology Office Note:  .   Date:  02/13/2023  ID:  Maurilio Lovely, DOB [birth year], MRN [id number] PCP: Lonie Peak, PA-C  [facility] Providers Cardiologist:  Thomasene Ripple, DO    History of Present Illness: .   Charles Goodman is a 52 y.o. male with a hx of mild coronary artery disease, hypertension and hyperlipidemia.   First seen by Dr. Servando Salina in 07/2019 for chest pain and tightening with radiation into the left shoulder. On 12/01/19 he had a coronary CTA that indicated a coronary calcium score of 79, this was 98 percentile for age and sex matched control, LAD with mild calcified plaque in the proximal portion. FFR analysis mostly normal, aside from 0.74 in small distal D1. He was started on aspirin and lipitor. Echo 10/2019 showed EF 50 to 55%. Continued to note atypical chest pain, recommended to start Ranexa, patient had not started at last visit. At last OV on 12/26/21 he denied anginal symptoms, noted fluttering's in his chest, recommended to use Kardia mobile.  Today he reports he is doing very well overall, he has no concerns or complaints. He denies anginal symptoms. The fluttering he noted a year ago seem no longer present, he is unsure the last time they occurred. He is working to get in 10,000 steps a day. Reports his diet is not the greatest, he regularly skips lunch and has a large dinner late in the day. He does not eat fast food.   Last labs 11/2022 Creatinine 0.72, BUN 11, eGFR 109, sodium 140, potassium 4.2, AST 58, ALT 74 Cholesterol 150, triglycerides 224, HDL 48, LDL 66  ROS: He denies chest pain, palpitations, dyspnea, pnd, orthopnea, dizziness, syncope, edema, weight gain, or early satiety. All other systems reviewed and are otherwise negative except as noted above.   Studies Reviewed: Marland Kitchen   EKG Interpretation  Date/Time:  Tuesday February 13 2023 08:10:22 EDT Ventricular Rate:  67 PR Interval:  194 QRS Duration: 92 QT Interval:  420 QTC  Calculation: 443 R Axis:   23 Text Interpretation: Normal sinus rhythm Confirmed by Reather Littler [phone]) on 02/13/2023 8:14:05 AM   Cardiac Studies & Procedures       ECHOCARDIOGRAM  ECHOCARDIOGRAM COMPLETE 10/21/2019  Narrative ECHOCARDIOGRAM REPORT    Patient Name:   BRISTOL SOY [facility] Date of Exam: 10/21/2019 Medical Rec #:  [id number]           Height:       72.0 in Accession #:    [id number]          Weight:       263.0 lb Date of Birth:  1971-01-31          BSA:          2.394 m Patient Age:    48 years            BP:           111/72 mmHg Patient Gender: M                   HR:           75 bpm. Exam Location:  [place]  Procedure: 2D Echo, Cardiac Doppler, Color Doppler and Strain Analysis  Indications:    CP,HTN,  History:        Patient has no prior history of Echocardiogram examinations. Risk Factors:Hypertension and Dyslipidemia.  Sonographer:    Sinda Du RDCS (AE) Referring Phys: [id number] KARDIE TOBB  IMPRESSIONS  1. Left ventricular ejection fraction, by estimation, is 50 to 55%. The left ventricle has low normal function. The left ventricle has no regional wall motion abnormalities. Left ventricular diastolic parameters were normal.  FINDINGS Left Ventricle: Left ventricular ejection fraction, by estimation, is 50 to 55%. The left ventricle has low normal function. The left ventricle has no regional wall motion abnormalities. The left ventricular internal cavity size was normal in size. There is no left ventricular hypertrophy. Left ventricular diastolic parameters were normal.  Right Ventricle: The right ventricular size is normal. No increase in right ventricular wall thickness. Right ventricular systolic function is normal. There is normal pulmonary artery systolic pressure. The tricuspid regurgitant velocity is 1.74 m/s, and with an assumed right atrial pressure of 3 mmHg, the estimated right ventricular systolic pressure is 15.1 mmHg.  Left Atrium:  Left atrial size was normal in size.  Right Atrium: Right atrial size was normal in size.  Pericardium: There is no evidence of pericardial effusion.  Mitral Valve: The mitral valve is normal in structure and function. Normal mobility of the mitral valve leaflets. No evidence of mitral valve regurgitation. No evidence of mitral valve stenosis.  Tricuspid Valve: The tricuspid valve is normal in structure. Tricuspid valve regurgitation is trivial. No evidence of tricuspid stenosis.  Aortic Valve: The aortic valve is normal in structure and function. Aortic valve regurgitation is not visualized. No aortic stenosis is present.  Pulmonic Valve: The pulmonic valve was normal in structure. Pulmonic valve regurgitation is not visualized. No evidence of pulmonic stenosis.  Aorta: The aortic root is normal in size and structure.  Venous: The inferior vena cava is normal in size with greater than 50% respiratory variability, suggesting right atrial pressure of 3 mmHg.  IAS/Shunts: No atrial level shunt detected by color flow Doppler.   LEFT VENTRICLE PLAX 2D LVIDd:         5.53 cm  Diastology LVIDs:         3.77 cm  LV e' medial:   10.90 cm/s LV PW:         1.07 cm  LV E/e' medial: 5.7 LV IVS:        1.16 cm LVOT diam:     2.20 cm LV SV:         49 LV SV Index:   21 LVOT Area:     3.80 cm   RIGHT VENTRICLE             IVC RV Basal diam:  4.38 cm     IVC diam: 2.03 cm RV S prime:     17.70 cm/s TAPSE (M-mode): 2.5 cm  LEFT ATRIUM             Index       RIGHT ATRIUM           Index LA diam:        3.80 cm 1.59 cm/m  RA Area:     17.60 cm LA Vol (A2C):   57.8 ml 24.15 ml/m RA Volume:   44.20 ml  18.47 ml/m LA Vol (A4C):   73.1 ml 30.54 ml/m LA Biplane Vol: 65.7 ml 27.45 ml/m AORTIC VALVE LVOT Vmax:   58.70 cm/s LVOT Vmean:  47.100 cm/s LVOT VTI:    0.130 m  AORTA Ao Root diam:  3.30 cm Ao Sinus diam: 3.44 cm Ao Asc diam:   2.90 cm  MITRAL VALVE  TRICUSPID  VALVE MV Area (PHT): 4.63 cm    TR Peak grad:   12.1 mmHg MV Decel Time: 164 msec    TR Vmax:        174.00 cm/s MV E velocity: 62.10 cm/s MV A velocity: 60.00 cm/s  SHUNTS MV E/A ratio:  1.04        Systemic VTI:  0.13 m Systemic Diam: 2.20 cm  Belva Crome MD Electronically signed by Belva Crome MD Signature Date/Time: 10/21/2019/10:11:47 AM    Final     CT SCANS  CT CORONARY MORPH W/CTA COR W/SCORE 12/01/2019  Addendum 12/01/2019  5:36 PM ADDENDUM REPORT: 12/01/2019 17:33  EXAM: CT FFR ANALYSIS  CLINICAL DATA:  52 year old male with CAD and hypertension.  FINDINGS: FFRct analysis was performed on the original cardiac CT angiogram dataset. Diagrammatic representation of the FFRct analysis is provided in a separate PDF document in PACS. This dictation was created using the PDF document and an interactive 3D model of the results. 3D model is not available in the EMR/PACS. Normal FFR range is >0.80.  1. Left Main: 0.98  2. LAD: Proximal 0.97, Mid 0.93, Distal 0.90. D1: Proximal 0.98, Mid 0.96, Distal 0.74 3. LCX: Proximal 0.97, Mid 0.96, Distal 0.94. OM1 proximal 0.90, Distal 0.83 4. RCA: 0.97  IMPRESSION: 1. CT FFR analysis as described above abnormal in the distal D1 vessel. This is a very short portion of the vessel. Recommend aggressive medical management prior to consideration of left heart catheterization.   Electronically Signed By: Thomasene Ripple MD On: 12/01/2019 17:33  Addendum 09/11/2019  2:50 PM ADDENDUM REPORT: 09/11/2019 14:47  CLINICAL DATA:  52 year old male with atypical angina. Medical history includes Hypertension and Hyperlipidemia.  EXAM: Cardiac/Coronary  CT  TECHNIQUE: The patient was scanned on a [employer].  FINDINGS: A 120 kV prospective scan was triggered in the descending thoracic aorta at 111 HU's. Axial non-contrast 3 mm slices were carried out through the heart. The data set was analyzed on a dedicated  work station and scored using the Agatson method. Gantry rotation speed was 250 msecs and collimation was .6 mm. No beta blockade and 0.8 mg of sl NTG was given. The 3D data set was reconstructed in 5% intervals of the 67-82 % of the R-R cycle. Diastolic phases were analyzed on a dedicated work station using MPR, MIP and VRT modes. The patient received 80 cc of contrast.  Aorta: Normal size.  No calcifications.  No dissection.  Aortic Valve:  Trileaflet.  No calcifications.  Coronary Arteries:  Normal coronary origin.  Right dominance.  RCA is a large dominant artery that gives rise to PDA and PLVB. There is no plaque.  Left main is a large artery that gives rise to LAD and LCX arteries.  LAD is a large vessel with mild (25-49%) calcified plaque in the proximal portion of the vessel. Minimal calcified plaque in the mid portion of the vessel. There is no plaque in the distal portion of the vessel. D1 has a high take off and it is a medium caliber vessel with mild soft plaque in the proximal portion. D2 is a large caliber vessel with minimal mid vessel calcified plaque.  LCX is a non-dominant artery that gives rise to one large OM1 branch. There is no plaque.  Other findings:  Normal pulmonary vein drainage into the left atrium.  Normal left atrial appendage without a thrombus.  Normal size of the pulmonary artery.  IMPRESSION: 1.  Coronary calcium score of 79. This was 66 percentile for age and sex matched control.  2. Normal coronary origin with right dominance.  3. Mild Coronary Artery Disease. CAD-RADS 2. Aggressive medical therapy is recommended.  Thomasene Ripple, DO   Electronically Signed By: Thomasene Ripple MD On: 09/11/2019 14:47  Narrative EXAM: OVER-READ INTERPRETATION  CT CHEST  The following report is an over-read performed by radiologist Dr. Trudie Reed of [facility] on 09/11/2019. This over-read does not include interpretation of  cardiac or coronary anatomy or pathology. The coronary calcium score/coronary CTA interpretation by the cardiologist is attached.  COMPARISON:  None.  FINDINGS: Within the visualized portions of the thorax there are no suspicious appearing pulmonary nodules or masses, there is no acute consolidative airspace disease, no pleural effusions, no pneumothorax and no lymphadenopathy. Visualized portions of the upper abdomen are unremarkable. There are no aggressive appearing lytic or blastic lesions noted in the visualized portions of the skeleton.  IMPRESSION: 1. No significant incidental noncardiac findings are noted.  Electronically Signed: By: Trudie Reed M.D. On: 09/11/2019 11:52              Physical Exam:   VS:  BP 122/88   Pulse 67   Ht 6' (1.829 m)   Wt 280 lb 12.8 oz (127.4 kg)   BMI 38.08 kg/m    Wt Readings from Last 3 Encounters:  02/13/23 280 lb 12.8 oz (127.4 kg)  12/26/21 281 lb 9.6 oz (127.7 kg)  09/02/20 269 lb 3.2 oz (122.1 kg)    GEN: Well nourished, well developed in no acute distress NECK: No JVD; No carotid bruits CARDIAC: RRR, no murmurs, rubs, gallops RESPIRATORY:  Clear to auscultation without rales, wheezing or rhonchi  ABDOMEN: Soft, non-tender, non-distended EXTREMITIES:  No edema; No deformity   ASSESSMENT AND PLAN: .     CAD: Coronary CTA in 2021 indicated LAD with mild calcified plaque, coronary calcium score of 79, 98th percentile. Stable with no anginal symptoms. No indication for ischemic evaluation. HTN well controlled. Working towards 10,000 steps a day. Heart healthy diet and regular cardiovascular exercise encouraged.  Continue aspirin, atorvastatin and losartan.    HTN: Blood pressure today 122/88. He does not regularly monitor at home. Primary care changed from Lisinopril to losartan due to cough. Continue losartan.   HLD: Last lipid panel 11/2022: Cholesterol 150, triglycerides 224, HDL 48, LDL 66. Continue atorvastatin 20mg   daily.     Dispo: Follow up in one year with Dr. Servando Salina or APP.   Signed, Rip Harbour, NP

## 2023-02-13 ENCOUNTER — Ambulatory Visit (INDEPENDENT_AMBULATORY_CARE_PROVIDER_SITE_OTHER): Payer: 59 | Admitting: Cardiology

## 2023-02-13 ENCOUNTER — Encounter (HOSPITAL_BASED_OUTPATIENT_CLINIC_OR_DEPARTMENT_OTHER): Payer: Self-pay | Admitting: Cardiology

## 2023-02-13 VITALS — BP 122/88 | HR 67 | Ht 72.0 in | Wt 280.8 lb

## 2023-02-13 DIAGNOSIS — I10 Essential (primary) hypertension: Secondary | ICD-10-CM

## 2023-02-13 DIAGNOSIS — I2510 Atherosclerotic heart disease of native coronary artery without angina pectoris: Secondary | ICD-10-CM | POA: Diagnosis not present

## 2023-02-13 DIAGNOSIS — E785 Hyperlipidemia, unspecified: Secondary | ICD-10-CM

## 2023-02-13 NOTE — Patient Instructions (Signed)
Medication Instructions:  Your physician recommends that you continue on your current medications as directed. Please refer to the Current Medication list given to you today.  *If you need a refill on your cardiac medications before your next appointment, please call your pharmacy*  Follow-Up: At [facility], you and your health needs are our priority.  As part of our continuing mission to provide you with exceptional heart care, we have created designated Provider Care Teams.  These Care Teams include your primary Cardiologist (physician) and Advanced Practice Providers (APPs -  Physician Assistants and Nurse Practitioners) who all work together to provide you with the care you need, when you need it.  We recommend signing up for the patient portal called "MyChart".  Sign up information is provided on this After Visit Summary.  MyChart is used to connect with patients for Virtual Visits (Telemedicine).  Patients are able to view lab/test results, encounter notes, upcoming appointments, etc.  Non-urgent messages can be sent to your provider as well.   To learn more about what you can do with MyChart, go to [email or web address].    Your next appointment:   1 year with Dr. Servando Salina

## 2023-09-19 DIAGNOSIS — H43822 Vitreomacular adhesion, left eye: Secondary | ICD-10-CM | POA: Diagnosis not present

## 2023-09-19 DIAGNOSIS — H59811 Chorioretinal scars after surgery for detachment, right eye: Secondary | ICD-10-CM | POA: Diagnosis not present

## 2023-11-22 DIAGNOSIS — I2510 Atherosclerotic heart disease of native coronary artery without angina pectoris: Secondary | ICD-10-CM | POA: Diagnosis not present

## 2023-11-22 DIAGNOSIS — M109 Gout, unspecified: Secondary | ICD-10-CM | POA: Diagnosis not present

## 2023-11-22 DIAGNOSIS — I10 Essential (primary) hypertension: Secondary | ICD-10-CM | POA: Diagnosis not present

## 2023-11-22 DIAGNOSIS — R7303 Prediabetes: Secondary | ICD-10-CM | POA: Diagnosis not present

## 2023-11-22 DIAGNOSIS — Z1331 Encounter for screening for depression: Secondary | ICD-10-CM | POA: Diagnosis not present

## 2023-11-22 DIAGNOSIS — E7800 Pure hypercholesterolemia, unspecified: Secondary | ICD-10-CM | POA: Diagnosis not present

## 2023-11-22 DIAGNOSIS — Z125 Encounter for screening for malignant neoplasm of prostate: Secondary | ICD-10-CM | POA: Diagnosis not present

## 2023-12-12 DIAGNOSIS — H59811 Chorioretinal scars after surgery for detachment, right eye: Secondary | ICD-10-CM | POA: Diagnosis not present

## 2023-12-12 DIAGNOSIS — H43822 Vitreomacular adhesion, left eye: Secondary | ICD-10-CM | POA: Diagnosis not present

## 2023-12-12 DIAGNOSIS — H34831 Tributary (branch) retinal vein occlusion, right eye, with macular edema: Secondary | ICD-10-CM | POA: Diagnosis not present

## 2024-01-09 DIAGNOSIS — Z1211 Encounter for screening for malignant neoplasm of colon: Secondary | ICD-10-CM | POA: Diagnosis not present

## 2024-01-09 DIAGNOSIS — Z1212 Encounter for screening for malignant neoplasm of rectum: Secondary | ICD-10-CM | POA: Diagnosis not present

## 2024-01-22 DIAGNOSIS — H34831 Tributary (branch) retinal vein occlusion, right eye, with macular edema: Secondary | ICD-10-CM | POA: Diagnosis not present

## 2024-01-24 DIAGNOSIS — R195 Other fecal abnormalities: Secondary | ICD-10-CM | POA: Diagnosis not present

## 2024-01-31 ENCOUNTER — Ambulatory Visit: Payer: Self-pay

## 2024-01-31 DIAGNOSIS — Z1211 Encounter for screening for malignant neoplasm of colon: Secondary | ICD-10-CM | POA: Diagnosis not present

## 2024-01-31 DIAGNOSIS — R195 Other fecal abnormalities: Secondary | ICD-10-CM | POA: Diagnosis not present

## 2024-01-31 DIAGNOSIS — K640 First degree hemorrhoids: Secondary | ICD-10-CM | POA: Diagnosis not present

## 2024-01-31 DIAGNOSIS — K5730 Diverticulosis of large intestine without perforation or abscess without bleeding: Secondary | ICD-10-CM | POA: Diagnosis not present

## 2024-02-20 DIAGNOSIS — H43822 Vitreomacular adhesion, left eye: Secondary | ICD-10-CM | POA: Diagnosis not present

## 2024-02-20 DIAGNOSIS — H34831 Tributary (branch) retinal vein occlusion, right eye, with macular edema: Secondary | ICD-10-CM | POA: Diagnosis not present

## 2024-02-20 DIAGNOSIS — H31091 Other chorioretinal scars, right eye: Secondary | ICD-10-CM | POA: Diagnosis not present

## 2024-04-30 DIAGNOSIS — H43822 Vitreomacular adhesion, left eye: Secondary | ICD-10-CM | POA: Diagnosis not present

## 2024-04-30 DIAGNOSIS — H31011 Macula scars of posterior pole (postinflammatory) (post-traumatic), right eye: Secondary | ICD-10-CM | POA: Diagnosis not present

## 2024-04-30 DIAGNOSIS — H34831 Tributary (branch) retinal vein occlusion, right eye, with macular edema: Secondary | ICD-10-CM | POA: Diagnosis not present

## 2024-04-30 DIAGNOSIS — H02403 Unspecified ptosis of bilateral eyelids: Secondary | ICD-10-CM | POA: Diagnosis not present

## 2024-04-30 DIAGNOSIS — H31091 Other chorioretinal scars, right eye: Secondary | ICD-10-CM | POA: Diagnosis not present

## 2024-06-10 ENCOUNTER — Other Ambulatory Visit: Payer: Self-pay | Admitting: Adult Health

## 2024-06-10 DIAGNOSIS — R10811 Right upper quadrant abdominal tenderness: Secondary | ICD-10-CM

## 2024-06-10 DIAGNOSIS — R198 Other specified symptoms and signs involving the digestive system and abdomen: Secondary | ICD-10-CM

## 2024-06-10 DIAGNOSIS — R7401 Elevation of levels of liver transaminase levels: Secondary | ICD-10-CM | POA: Diagnosis not present

## 2024-06-10 DIAGNOSIS — R1011 Right upper quadrant pain: Secondary | ICD-10-CM

## 2024-06-11 DIAGNOSIS — R1110 Vomiting, unspecified: Secondary | ICD-10-CM | POA: Diagnosis not present

## 2024-06-11 DIAGNOSIS — Z23 Encounter for immunization: Secondary | ICD-10-CM | POA: Diagnosis not present

## 2024-06-11 DIAGNOSIS — I2510 Atherosclerotic heart disease of native coronary artery without angina pectoris: Secondary | ICD-10-CM | POA: Diagnosis not present

## 2024-06-11 DIAGNOSIS — E7800 Pure hypercholesterolemia, unspecified: Secondary | ICD-10-CM | POA: Diagnosis not present

## 2024-06-11 DIAGNOSIS — R7303 Prediabetes: Secondary | ICD-10-CM | POA: Diagnosis not present

## 2024-06-11 DIAGNOSIS — I10 Essential (primary) hypertension: Secondary | ICD-10-CM | POA: Diagnosis not present

## 2024-06-11 DIAGNOSIS — M109 Gout, unspecified: Secondary | ICD-10-CM | POA: Diagnosis not present

## 2024-06-12 ENCOUNTER — Ambulatory Visit
Admission: RE | Admit: 2024-06-12 | Discharge: 2024-06-12 | Disposition: A | Discharge status: Home / Self Care | Source: Ambulatory Visit | Attending: Adult Health | Admitting: Adult Health

## 2024-06-12 ENCOUNTER — Other Ambulatory Visit: Payer: Self-pay | Admitting: Adult Health

## 2024-06-12 DIAGNOSIS — R1011 Right upper quadrant pain: Secondary | ICD-10-CM | POA: Diagnosis not present

## 2024-06-12 DIAGNOSIS — R198 Other specified symptoms and signs involving the digestive system and abdomen: Secondary | ICD-10-CM | POA: Insufficient documentation

## 2024-06-12 DIAGNOSIS — R748 Abnormal levels of other serum enzymes: Secondary | ICD-10-CM

## 2024-06-12 DIAGNOSIS — R10811 Right upper quadrant abdominal tenderness: Secondary | ICD-10-CM | POA: Insufficient documentation

## 2024-06-12 DIAGNOSIS — R109 Unspecified abdominal pain: Secondary | ICD-10-CM | POA: Diagnosis not present

## 2024-06-18 ENCOUNTER — Ambulatory Visit
Admission: RE | Admit: 2024-06-18 | Discharge: 2024-06-18 | Disposition: A | Discharge status: Home / Self Care | Source: Ambulatory Visit | Attending: Adult Health | Admitting: Adult Health

## 2024-06-18 DIAGNOSIS — R1011 Right upper quadrant pain: Secondary | ICD-10-CM | POA: Diagnosis not present

## 2024-06-18 DIAGNOSIS — R748 Abnormal levels of other serum enzymes: Secondary | ICD-10-CM | POA: Insufficient documentation

## 2024-06-18 MED ORDER — TECHNETIUM TC 99M MEBROFENIN IV KIT
5.0000 | PACK | Freq: Once | INTRAVENOUS | Status: AC | PRN
  Administered 2024-06-18: 5.15 via INTRAVENOUS

## 2024-06-25 DIAGNOSIS — R748 Abnormal levels of other serum enzymes: Secondary | ICD-10-CM | POA: Diagnosis not present

## 2024-06-25 DIAGNOSIS — Z23 Encounter for immunization: Secondary | ICD-10-CM | POA: Diagnosis not present

## 2024-06-25 DIAGNOSIS — K760 Fatty (change of) liver, not elsewhere classified: Secondary | ICD-10-CM | POA: Diagnosis not present

## 2024-07-01 ENCOUNTER — Ambulatory Visit: Payer: Self-pay | Admitting: General Surgery

## 2024-10-02 ENCOUNTER — Encounter
